# Patient Record
Sex: Male | Born: 1990 | Race: White | Hispanic: No | Marital: Single | Smoking: Never smoker
Health system: Southern US, Community
[De-identification: ages and names within clinical notes are randomized; demographics above are authoritative.]

## PROBLEM LIST (undated history)

## (undated) DIAGNOSIS — J45909 Unspecified asthma, uncomplicated: Secondary | ICD-10-CM

## (undated) HISTORY — DX: Unspecified asthma, uncomplicated: J45.909

## (undated) HISTORY — PX: OTHER SURGICAL HISTORY: SHX169

---

## 2015-05-14 ENCOUNTER — Encounter: Payer: Self-pay | Admitting: Medical

## 2015-05-14 ENCOUNTER — Telehealth: Payer: Self-pay | Admitting: Medical

## 2015-05-14 NOTE — Progress Notes (Signed)
This encounter was created in error - please disregard.

## 2015-05-14 NOTE — Telephone Encounter (Signed)
No Charge for today.

## 2015-05-14 NOTE — Telephone Encounter (Signed)
Pt called 10:18am stating that [place] and was not able to call sooner because we opened at 10:00am. Rescheduled for tomorrow. Charge or no charge?

## 2015-05-15 ENCOUNTER — Ambulatory Visit (INDEPENDENT_AMBULATORY_CARE_PROVIDER_SITE_OTHER): Payer: 59 | Admitting: Medical

## 2015-05-15 ENCOUNTER — Encounter: Payer: Self-pay | Admitting: Medical

## 2015-05-15 VITALS — BP 126/84 | HR 65 | Temp 98.1°F | Resp 16 | Ht 73.5 in | Wt 243.6 lb

## 2015-05-15 DIAGNOSIS — J300 Vasomotor rhinitis: Secondary | ICD-10-CM | POA: Diagnosis not present

## 2015-05-15 DIAGNOSIS — H9202 Otalgia, left ear: Secondary | ICD-10-CM

## 2015-05-15 DIAGNOSIS — K121 Other forms of stomatitis: Secondary | ICD-10-CM

## 2015-05-15 DIAGNOSIS — Z8709 Personal history of other diseases of the respiratory system: Secondary | ICD-10-CM | POA: Diagnosis not present

## 2015-05-15 LAB — CBC WITH DIFFERENTIAL/PLATELET
BASOS ABS: 0 10*3/uL (ref 0.0–0.1)
Basophils Relative: 0.4 % (ref 0.0–3.0)
Eosinophils Absolute: 0.1 10*3/uL (ref 0.0–0.7)
Eosinophils Relative: 1.2 % (ref 0.0–5.0)
HCT: 48.9 % (ref 39.0–52.0)
Hemoglobin: 16.6 g/dL (ref 13.0–17.0)
LYMPHS ABS: 2.2 10*3/uL (ref 0.7–4.0)
Lymphocytes Relative: 32 % (ref 12.0–46.0)
MCHC: 34 g/dL (ref 30.0–36.0)
MCV: 85 fl (ref 78.0–100.0)
MONO ABS: 0.7 10*3/uL (ref 0.1–1.0)
MONOS PCT: 10 % (ref 3.0–12.0)
NEUTROS ABS: 3.9 10*3/uL (ref 1.4–7.7)
NEUTROS PCT: 56.4 % (ref 43.0–77.0)
PLATELETS: 258 10*3/uL (ref 150.0–400.0)
RBC: 5.75 Mil/uL (ref 4.22–5.81)
RDW: 13.2 % (ref 11.5–15.5)
WBC: 6.8 10*3/uL (ref 4.0–10.5)

## 2015-05-15 MED ORDER — CLARITHROMYCIN ER 500 MG PO TB24: 1000.0000 mg | ORAL_TABLET | Freq: Every day | ORAL | Status: DC

## 2015-05-15 MED FILL — CLARITHROMYCIN ER 500 MG TA: 500 | 14 days supply | Qty: 28 | Fill #0

## 2015-05-15 NOTE — Addendum Note (Signed)
Addended by: Gwenevere AbbotSAGUIER, Elbia Paro M on: 05/15/2015 02:08 PM   Modules accepted: Orders

## 2015-05-15 NOTE — Progress Notes (Signed)
Pre visit review using our clinic review tool, if applicable. No additional management support is needed unless otherwise documented below in the visit note. 

## 2015-05-15 NOTE — Progress Notes (Signed)
Subjective:    Patient ID: Clarence Gonzales, male    DOB: [birth year], 25 y.o.   MRN: [id number]  HPI  I have reviewed pt PMH, PSH, FH, Social History and Surgical History  Pt is getting Artist. Pt not exercising much, rare caffeinated beverage, pt state some weight gain since being in school and working. Single.  Pt mentions some sinus symptoms/infections some times after drastic temperature changes. Will get nasal congestion and then sinus infections. About twice a year gets sinus infections.  Pt had blood work for insurance physical just recently. Labs were normal.  Pt in states in March he had 8 teeth excised. He states he had wisdom teeth got impacted and was decayed. He had some complications after surgery. Severe swelling post surgery. Some lesions in mouth after surgery.   Pt also had some cavities filled after procedure. Swelling with cavity filling as well. September is when he had complications after filling. 3 filling after September had some ulcers near anesthetic injection site. But no describes severe swelling.   Pt recently went [facility] and given oral gel on Monday for what he was told was apthous ulcer type lesion.  Just recently has sore throat for about 10 days. Sharp pain swallowing now. Also some ear pain let side. He is about to travel to [place] on Friday afternoon.   Pt had allergic reaction to pcn in the past. Severe reaction.   Pt states he has used zpack , biaxin and e-mycin in the past. No reactions.      Review of Systems  Constitutional: Negative for fever, chills and fatigue.  HENT: Positive for ear pain.        Ulcer/stomatitis.   Respiratory: Negative for cough, chest tightness, shortness of breath and wheezing.   Cardiovascular: Negative for chest pain and palpitations.  Neurological: Negative for dizziness, seizures, syncope, speech difficulty, weakness, numbness and headaches.    Hematological: Negative for adenopathy. Does not bruise/bleed easily.     History reviewed. No pertinent past medical history.  Social History   Social History  . Marital Status: Single    Spouse Name: N/A  . Number of Children: N/A  . Years of Education: N/A   Occupational History  . Not on file.   Social History Main Topics  . Smoking status: Never Smoker   . Smokeless tobacco: Never Used  . Alcohol Use: 0.0 oz/week    0 Standard drinks or equivalent per week     Comment: Occasional   . Drug Use: No  . Sexual Activity: Not on file   Other Topics Concern  . Not on file   Social History Narrative  . No narrative on file    History reviewed. No pertinent past surgical history.  Family History  Problem Relation Age of Onset  . Diabetes Maternal Grandfather     Allergies  Allergen Reactions  . Amoxicillin Anaphylaxis  . Penicillins Anaphylaxis  . Latex Other (See Comments)    Senstivity     No current outpatient prescriptions on file prior to visit.   No current facility-administered medications on file prior to visit.    BP 126/84 mmHg  Pulse 65  Temp(Src) 98.1 F (36.7 C) (Oral)  Resp 16  Ht 6' 1.5" (1.867 m)  Wt 243 lb 9.6 oz (110.496 kg)  BMI 31.70 kg/m2  SpO2 97%       Objective:   Physical Exam  General  Mental Status - Alert.  General Appearance - Well groomed. Not in acute distress.  Skin Rashes- No Rashes.  HEENT Head- Normal. Ear Auditory Canal - Left- Normal. Right - Normal.Tympanic Membrane- Left- mild dull pink appearance(no mastoid tenderness. No tragal tenderness. Right- Normal. Eye Sclera/Conjunctiva- Left- Normal. Right- Normal. Nose & Sinuses Nasal Mucosa- Left-  Not Boggy or  Congested. Right-  Not Boggy and  Congested.Bilateral no maxillary and no  frontal sinus pressure. Mouth & Throat Lips: Upper Lip- Normal: no dryness, cracking, pallor, cyanosis, or vesicular eruption. Lower Lip-Normal: no dryness, cracking,  pallor, cyanosis or vesicular eruption. Buccal Mucosa- Bilateral- Back of mouth left side. Posterior pharynx region and behind molar teeth 8mm diameter apthous ulcer type lesion. Oropharynx- No Discharge  Mild  Erythema. Tonsils: Characteristics- Bilateral- mild Erythema Size/Enlargement- Bilateral- No enlargement. Discharge- bilateral-None.   Neck Neck- Supple. No Masses.   Chest and Lung Exam Auscultation: Breath Sounds:-Clear even and unlabored.  Cardiovascular Auscultation:Rythm- Regular, rate and rhythm. Murmurs & Other Heart Sounds:Ausculatation of the heart reveal- No Murmurs.  Lymphatic Head & Neck General Head & Neck Lymphatics: Bilateral: Description- No Localized lymphadenopathy. Of neck region.       Assessment & Plan:  For your ear pain, history of sinus infections and history of dental complication will prescribe  biaxin antibioitic.  With your various dental procedures and complications, I do want to get cbc to assess your infection fighting cells.  I want you to give us update tomorrow how you feel when we update you on your labs.  I will refer you to ent and try to get you in by next Monday or Tuesday(if possible could be longer).  Go ahead and schedule appointment with me on Monday or Tuesday in event ent appointment delayed.  You may also need to see previous oral surgeon depending on how you respond to antibiotic.  If while out of town your conditions worsen be seen by urgent care or ED.

## 2015-05-15 NOTE — Assessment & Plan Note (Signed)
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

## 2015-05-15 NOTE — Patient Instructions (Addendum)
For your ear pain, history of sinus infections and history of dental complication will prescribe  biaxin antibioitic.  With your various dental procedures and complications, I do want to get cbc to assess your infection fighting cells.  I want you to give us update tomorrow how you feel when we update you on your labs.  I will refer you to ent and try to get you in by next Monday or Tuesday(if possible could be longer).  Go ahead and schedule appointment with me on Monday or Tuesday in event ent appointment delayed.  You may also need to see previous oral surgeon depending on how you respond to antibiotic.  If while out of town your conditions worsen be seen by urgent care or ED.

## 2015-05-16 ENCOUNTER — Telehealth: Payer: Self-pay | Admitting: Medical

## 2015-05-16 NOTE — Telephone Encounter (Signed)
Pt states not better or worse about the same. Only on second dose of antibiotic. His wbc were not elevated. Notified him of lab. Will follow up with me on Monday.

## 2015-05-20 ENCOUNTER — Telehealth: Payer: Self-pay | Admitting: Medical

## 2015-05-20 ENCOUNTER — Encounter: Payer: 59 | Admitting: Medical

## 2015-05-20 NOTE — Progress Notes (Signed)
This encounter was created in error - please disregard.

## 2015-05-22 NOTE — Telephone Encounter (Signed)
Pt was no show for f/u appt 05/20/15 11:00am, pt has not rescheduled, charge or no charge?

## 2015-05-23 NOTE — Telephone Encounter (Signed)
No charge. If he does again then charge.

## 2015-10-24 ENCOUNTER — Emergency Department (HOSPITAL_BASED_OUTPATIENT_CLINIC_OR_DEPARTMENT_OTHER)
Admission: EM | Admit: 2015-10-24 | Discharge: 2015-10-24 | Disposition: A | Discharge status: Home / Self Care | Payer: 59 | Attending: Emergency Medicine | Admitting: Emergency Medicine

## 2015-10-24 ENCOUNTER — Emergency Department (HOSPITAL_BASED_OUTPATIENT_CLINIC_OR_DEPARTMENT_OTHER): Payer: 59

## 2015-10-24 ENCOUNTER — Encounter (HOSPITAL_BASED_OUTPATIENT_CLINIC_OR_DEPARTMENT_OTHER): Payer: Self-pay

## 2015-10-24 DIAGNOSIS — Y92038 Other place in apartment as the place of occurrence of the external cause: Secondary | ICD-10-CM | POA: Diagnosis not present

## 2015-10-24 DIAGNOSIS — W108XXA Fall (on) (from) other stairs and steps, initial encounter: Secondary | ICD-10-CM | POA: Diagnosis not present

## 2015-10-24 DIAGNOSIS — S93402A Sprain of unspecified ligament of left ankle, initial encounter: Secondary | ICD-10-CM

## 2015-10-24 DIAGNOSIS — Y9389 Activity, other specified: Secondary | ICD-10-CM | POA: Diagnosis not present

## 2015-10-24 DIAGNOSIS — S99912A Unspecified injury of left ankle, initial encounter: Secondary | ICD-10-CM | POA: Diagnosis present

## 2015-10-24 DIAGNOSIS — Y999 Unspecified external cause status: Secondary | ICD-10-CM | POA: Insufficient documentation

## 2015-10-24 MED ORDER — IBUPROFEN 800 MG PO TABS
800.0000 mg | ORAL_TABLET | Freq: Once | ORAL | Status: AC
  Administered 2015-10-24: 800 mg via ORAL
  Filled 2015-10-24: qty 1

## 2015-10-24 NOTE — ED Notes (Addendum)
Feel down steps approx 30 min PTA-pain to left ankle-presents to triage in w/c-abrasion noted to right knee-denies pain to knee-states "just need a bandaid"

## 2015-10-24 NOTE — ED Provider Notes (Signed)
CSN: [id number]     Arrival date & time 10/24/15  2012 History  By signing my name below, I, Clarence Gonzales, attest that this documentation has been prepared under the direction and in the presence of Melene Planan Helaine Yackel, DO.  Electronically Signed: Gillis EndsJasmyn B. Lyn Gonzales, ED Scribe. 10/24/2015. 9:21 PM.   Chief Complaint  Patient presents with  . Fall   Patient is a [age] y.o. male presenting with ankle pain. The history is provided by the patient. No language interpreter was used.  Ankle Pain Location:  Ankle Injury: yes   Mechanism of injury: fall   Fall:    Fall occurred:  Down stairs   Entrapped after fall: no   Ankle location:  L ankle Pain details:    Quality:  Sharp   Radiates to:  Does not radiate   Severity:  Mild   Onset quality:  Gradual   Duration:  1 hour   Timing:  Constant   Progression:  Unchanged Chronicity:  New Dislocation: no   Prior injury to area:  Yes Worsened by:  Bearing weight and flexion Ineffective treatments:  None tried Associated symptoms: no fever    HPI Comments: Clarence Gonzales is a 25 y.o. male who presents to the Emergency Department complaining of sudden onset, constant, left ankle pain s/p fall that occurred PTA. Pt reports he was moving boxes out of his apartment when he missed the bottom step and fell to the ground, rolling his left ankle. Pt has associated swelling of the ankle. There are no modifying factors. Tetanus status is up to date. Pt has no other complaints at this time.  History reviewed. No pertinent past medical history. History reviewed. No pertinent past surgical history. Family History  Problem Relation Age of Onset  . Diabetes Maternal Grandfather    Social History  Substance Use Topics  . Smoking status: Never Smoker   . Smokeless tobacco: Never Used  . Alcohol Use: No     Comment:      Review of Systems  Constitutional: Negative for fever and chills.  HENT: Negative for congestion and facial swelling.   Eyes: Negative  for discharge and visual disturbance.  Respiratory: Negative for shortness of breath.   Cardiovascular: Negative for chest pain and palpitations.  Gastrointestinal: Negative for vomiting, abdominal pain and diarrhea.  Musculoskeletal: Positive for myalgias, joint swelling and arthralgias.  Skin: Negative for color change and rash.  Neurological: Negative for tremors, syncope and headaches.  Psychiatric/Behavioral: Negative for confusion and dysphoric mood.  All other systems reviewed and are negative.   Allergies  Amoxicillin; Penicillins; and Latex  Home Medications   Prior to Admission medications   Not on File   BP 128/80 mmHg  Pulse 66  Temp(Src) 98.2 F (36.8 C) (Oral)  Resp 18  Ht 6\' 1"  (1.854 m)  Wt 235 lb (106.595 kg)  BMI 31.01 kg/m2  SpO2 100% Physical Exam  Constitutional: He is oriented to person, place, and time. He appears well-developed and well-nourished.  HENT:  Head: Normocephalic and atraumatic.  Eyes: EOM are normal. Pupils are equal, round, and reactive to light.  Neck: Normal range of motion. Neck supple. No JVD present.  Cardiovascular: Normal rate and regular rhythm.  Exam reveals no gallop and no friction rub.   No murmur heard. Pulmonary/Chest: No respiratory distress. He has no wheezes.  Abdominal: He exhibits no distension. There is no rebound and no guarding.  Musculoskeletal: Normal range of motion. He exhibits tenderness.  Tenderness about  medial and lateral maleoli of left ankle. No tenderness at proximal fibula.   Neurological: He is alert and oriented to person, place, and time.  Pulse, motor and sensation is intact.  Skin: No rash noted. No pallor.  Abrasion to left patella of right knee.    Psychiatric: He has a normal mood and affect. His behavior is normal.  Nursing note and vitals reviewed.   ED Course  Procedures (including critical care time) DIAGNOSTIC STUDIES: Oxygen Saturation is 100% on RA, normal by my interpretation.     COORDINATION OF CARE: 9:15 PM-Discussed treatment plan which includes Left ankle X-ray with pt at bedside and pt agreed to plan. Will order Ibuprofen.  Imaging Review Dg Ankle Complete Left  10/24/2015  CLINICAL DATA:  Twisting injury left ankle coming down stairs tonight with lateral malleolar pain and swelling. EXAM: LEFT ANKLE COMPLETE - 3+ VIEW COMPARISON:  None. FINDINGS: There is no evidence of fracture, dislocation, or joint effusion. There is no evidence of arthropathy or other focal bone abnormality. Soft tissues are unremarkable. IMPRESSION: Negative. Electronically Signed   By: Elberta Fortisaniel  Boyle M.D.   On: 10/24/2015 21:30   I have personally reviewed and evaluated these images and lab results as part of my medical decision-making.    MDM   Final diagnoses:  Left ankle sprain, initial encounter    25 yo M With a chief complaint of left ankle pain. This is after a rolling mechanism. Patient with pain of about bilateral malleolus. No noted knee pain or proximal fibular tenderness. X-ray of ankle is negative. Discharge home.  I personally performed the services described in this documentation, which was scribed in my presence. The recorded information has been reviewed and is accurate.    10:15 PM:  I have discussed the diagnosis/risks/treatment options with the patient and family and believe the pt to be eligible for discharge home to follow-up with PCP. We also discussed returning to the ED immediately if new or worsening sx occur. We discussed the sx which are most concerning (e.g., sudden worsening pain, fever, inability to tolerate by mouth) that necessitate immediate return. Medications administered to the patient during their visit and any new prescriptions provided to the patient are listed below.  Medications given during this visit Medications  ibuprofen (ADVIL,MOTRIN) tablet 800 mg (800 mg Oral Given 10/24/15 2116)    There are no discharge medications for this  patient.   The patient appears reasonably screen and/or stabilized for discharge and I doubt any other medical condition or other [facility] requiring further screening, evaluation, or treatment in the ED at this time prior to discharge.     Melene Planan Aashish Hamm, DO 10/24/15 2215

## 2015-10-24 NOTE — ED Notes (Signed)
Pt verbalizes understanding of d/c instructions and denies any further needs at this time. 

## 2015-10-24 NOTE — Discharge Instructions (Signed)
Take 4 over the counter ibuprofen tablets 3 times a day or 2 over-the-counter naproxen tablets twice a day for pain.  Acute Ankle Sprain With Phase I Rehab An acute ankle sprain is a partial or complete tear in one or more of the ligaments of the ankle due to traumatic injury. The severity of the injury depends on both the number of ligaments sprained and the grade of sprain. There are 3 grades of sprains.   A grade 1 sprain is a mild sprain. There is a slight pull without obvious tearing. There is no loss of strength, and the muscle and ligament are the correct length.  A grade 2 sprain is a moderate sprain. There is tearing of fibers within the substance of the ligament where it connects two bones or two cartilages. The length of the ligament is increased, and there is usually decreased strength.  A grade 3 sprain is a complete rupture of the ligament and is uncommon. In addition to the grade of sprain, there are three types of ankle sprains.  Lateral ankle sprains: This is a sprain of one or more of the three ligaments on the outer side (lateral) of the ankle. These are the most common sprains. Medial ankle sprains: There is one large triangular ligament of the inner side (medial) of the ankle that is susceptible to injury. Medial ankle sprains are less common. Syndesmosis, "high ankle," sprains: The syndesmosis is the ligament that connects the two bones of the lower leg. Syndesmosis sprains usually only occur with very severe ankle sprains. SYMPTOMS  Pain, tenderness, and swelling in the ankle, starting at the side of injury that may progress to the whole ankle and foot with time.  "Pop" or tearing sensation at the time of injury.  Bruising that may spread to the heel.  Impaired ability to walk soon after injury. CAUSES   Acute ankle sprains are caused by trauma placed on the ankle that temporarily forces or pries the anklebone (talus) out of its normal socket.  Stretching or  tearing of the ligaments that normally hold the joint in place (usually due to a twisting injury). RISK INCREASES WITH:  Previous ankle sprain.  Sports in which the foot may land awkwardly (i.e., basketball, volleyball, or soccer) or walking or running on uneven or rough surfaces.  Shoes with inadequate support to prevent sideways motion when stress occurs.  Poor strength and flexibility.  Poor balance skills.  Contact sports. PREVENTION   Warm up and stretch properly before activity.  Maintain physical fitness:  Ankle and leg flexibility, muscle strength, and endurance.  Cardiovascular fitness.  Balance training activities.  Use proper technique and have a coach correct improper technique.  Taping, protective strapping, bracing, or high-top tennis shoes may help prevent injury. Initially, tape is best; however, it loses most of its support function within 10 to 15 minutes.  Wear proper-fitted protective shoes (High-top shoes with taping or bracing is more effective than either alone).  Provide the ankle with support during sports and practice activities for 12 months following injury. PROGNOSIS   If treated properly, ankle sprains can be expected to recover completely; however, the length of recovery depends on the degree of injury.  A grade 1 sprain usually heals enough in 5 to 7 days to allow modified activity and requires an average of 6 weeks to heal completely.  A grade 2 sprain requires 6 to 10 weeks to heal completely.  A grade 3 sprain requires 12 to 16 weeks to  heal.  A syndesmosis sprain often takes more than 3 months to heal. RELATED COMPLICATIONS   Frequent recurrence of symptoms may result in a chronic problem. Appropriately addressing the problem the first time decreases the frequency of recurrence and optimizes healing time. Severity of the initial sprain does not predict the likelihood of later instability.  Injury to other structures (bone, cartilage,  or tendon).  A chronically unstable or arthritic ankle joint is a possibility with repeated sprains. TREATMENT Treatment initially involves the use of ice, medication, and compression bandages to help reduce pain and inflammation. Ankle sprains are usually immobilized in a walking cast or boot to allow for healing. Crutches may be recommended to reduce pressure on the injury. After immobilization, strengthening and stretching exercises may be necessary to regain strength and a full range of motion. Surgery is rarely needed to treat ankle sprains. MEDICATION   Nonsteroidal anti-inflammatory medications, such as aspirin and ibuprofen (do not take for the first 3 days after injury or within 7 days before surgery), or other minor pain relievers, such as acetaminophen, are often recommended. Take these as directed by your caregiver. Contact your caregiver immediately if any bleeding, stomach upset, or signs of an allergic reaction occur from these medications.  Ointments applied to the skin may be helpful.  Pain relievers may be prescribed as necessary by your caregiver. Do not take prescription pain medication for longer than 4 to 7 days. Use only as directed and only as much as you need. HEAT AND COLD  Cold treatment (icing) is used to relieve pain and reduce inflammation for acute and chronic cases. Cold should be applied for 10 to 15 minutes every 2 to 3 hours for inflammation and pain and immediately after any activity that aggravates your symptoms. Use ice packs or an ice massage.  Heat treatment may be used before performing stretching and strengthening activities prescribed by your caregiver. Use a heat pack or a warm soak. SEEK IMMEDIATE MEDICAL CARE IF:   Pain, swelling, or bruising worsens despite treatment.  You experience pain, numbness, discoloration, or coldness in the foot or toes.  New, unexplained symptoms develop (drugs used in treatment may produce side effects.) EXERCISES    PHASE I EXERCISES RANGE OF MOTION (ROM) AND STRETCHING EXERCISES - Ankle Sprain, Acute Phase I, Weeks 1 to 2 These exercises may help you when beginning to restore flexibility in your ankle. You will likely work on these exercises for the 1 to 2 weeks after your injury. Once your physician, physical therapist, or athletic trainer sees adequate progress, he or she will advance your exercises. While completing these exercises, remember:   Restoring tissue flexibility helps normal motion to return to the joints. This allows healthier, less painful movement and activity.  An effective stretch should be held for at least 30 seconds.  A stretch should never be painful. You should only feel a gentle lengthening or release in the stretched tissue. RANGE OF MOTION - Dorsi/Plantar Flexion  While sitting with your right / left knee straight, draw the top of your foot upwards by flexing your ankle. Then reverse the motion, pointing your toes downward.  Hold each position for __________ seconds.  After completing your first set of exercises, repeat this exercise with your knee bent. Repeat __________ times. Complete this exercise __________ times per day.  RANGE OF MOTION - Ankle Alphabet  Imagine your right / left big toe is a pen.  Keeping your hip and knee still, write out the entire  alphabet with your "pen." Make the letters as large as you can without increasing any discomfort. Repeat __________ times. Complete this exercise __________ times per day.  STRENGTHENING EXERCISES - Ankle Sprain, Acute -Phase I, Weeks 1 to 2 These exercises may help you when beginning to restore strength in your ankle. You will likely work on these exercises for 1 to 2 weeks after your injury. Once your physician, physical therapist, or athletic trainer sees adequate progress, he or she will advance your exercises. While completing these exercises, remember:   Muscles can gain both the endurance and the strength needed  for everyday activities through controlled exercises.  Complete these exercises as instructed by your physician, physical therapist, or athletic trainer. Progress the resistance and repetitions only as guided.  You may experience muscle soreness or fatigue, but the pain or discomfort you are trying to eliminate should never worsen during these exercises. If this pain does worsen, stop and make certain you are following the directions exactly. If the pain is still present after adjustments, discontinue the exercise until you can discuss the trouble with your clinician. STRENGTH - Dorsiflexors  Secure a rubber exercise band/tubing to a fixed object (i.e., table, pole) and loop the other end around your right / left foot.  Sit on the floor facing the fixed object. The band/tubing should be slightly tense when your foot is relaxed.  Slowly draw your foot back toward you using your ankle and toes.  Hold this position for __________ seconds. Slowly release the tension in the band and return your foot to the starting position. Repeat __________ times. Complete this exercise __________ times per day.  STRENGTH - Plantar-flexors   Sit with your right / left leg extended. Holding onto both ends of a rubber exercise band/tubing, loop it around the ball of your foot. Keep a slight tension in the band.  Slowly push your toes away from you, pointing them downward.  Hold this position for __________ seconds. Return slowly, controlling the tension in the band/tubing. Repeat __________ times. Complete this exercise __________ times per day.  STRENGTH - Ankle Eversion  Secure one end of a rubber exercise band/tubing to a fixed object (table, pole). Loop the other end around your foot just before your toes.  Place your fists between your knees. This will focus your strengthening at your ankle.  Drawing the band/tubing across your opposite foot, slowly, pull your little toe out and up. Make sure the  band/tubing is positioned to resist the entire motion.  Hold this position for __________ seconds. Have your muscles resist the band/tubing as it slowly pulls your foot back to the starting position.  Repeat __________ times. Complete this exercise __________ times per day.  STRENGTH - Ankle Inversion  Secure one end of a rubber exercise band/tubing to a fixed object (table, pole). Loop the other end around your foot just before your toes.  Place your fists between your knees. This will focus your strengthening at your ankle.  Slowly, pull your big toe up and in, making sure the band/tubing is positioned to resist the entire motion.  Hold this position for __________ seconds.  Have your muscles resist the band/tubing as it slowly pulls your foot back to the starting position. Repeat __________ times. Complete this exercises __________ times per day.  STRENGTH - Towel Curls  Sit in a chair positioned on a non-carpeted surface.  Place your right / left foot on a towel, keeping your heel on the floor.  Pull the  towel toward your heel by only curling your toes. Keep your heel on the floor.  If instructed by your physician, physical therapist, or athletic trainer, add weight to the end of the towel. Repeat __________ times. Complete this exercise __________ times per day.   This information is not intended to replace advice given to you by your health care provider. Make sure you discuss any questions you have with your health care provider.   Document Released: 11/19/2004 Document Revised: 05/11/2014 Document Reviewed: 08/02/2008 Elsevier Interactive Patient Education [employer] Inc.

## 2015-10-24 NOTE — ED Notes (Signed)
Pt rolled left ankle coming down the stairs, swelling and pain to ankle and abrasion to right knee.  Capillary refill good, pt able to wiggle toes and pulses strong.  MD att bedside.

## 2015-10-24 NOTE — ED Notes (Signed)
Pt declines crutches  

## 2016-01-27 ENCOUNTER — Ambulatory Visit (INDEPENDENT_AMBULATORY_CARE_PROVIDER_SITE_OTHER): Payer: BLUE CROSS/BLUE SHIELD | Admitting: Medical

## 2016-01-27 ENCOUNTER — Encounter: Payer: Self-pay | Admitting: Medical

## 2016-01-27 VITALS — BP 131/78 | HR 59 | Temp 98.7°F | Ht 73.5 in | Wt 253.6 lb

## 2016-01-27 DIAGNOSIS — R1013 Epigastric pain: Secondary | ICD-10-CM

## 2016-01-27 DIAGNOSIS — F4323 Adjustment disorder with mixed anxiety and depressed mood: Secondary | ICD-10-CM

## 2016-01-27 MED ORDER — RANITIDINE HCL 150 MG PO CAPS: 150.0000 mg | ORAL_CAPSULE | Freq: Two times a day (BID) | ORAL | 0 refills | Status: DC

## 2016-01-27 NOTE — Progress Notes (Signed)
Pre visit review using our clinic tool,if applicable. No additional management support is needed unless otherwise documented below in the visit note.  

## 2016-01-27 NOTE — Progress Notes (Signed)
Subjective:    Patient ID: Clarence Gonzales, male    DOB: 29-May-1990, 25 y.o.   MRN: [id number]  HPI Pt in for some abdomen pain.   Pt did have some pepto bismal this weekend. Pt pain is epigastric region and  low level pain. On and off pain. Patient notes some pain worse after eating.  Recently decreased appetitie. No nauseau or vomiting. Some increase belching. No back pain with stomach pain. Pt states high pain tolerance.    Pt states he had some black appearing stools about one month ago. He had that for 2 stools. Then normalized. Then had some pain in abdomen around that time. No peptobismol around time had stools.(he not aware).  Pt states having some stress and difficulty with working full time both school and work. 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 Some decreased mood with low level anxiety. Concentation issues at end of the day. But denies ADD. Wants something other than stimulants(he mentioned provigil). But I don't think indicated. His work schedule is normal hours.  Pt does not think he has add.   Pt works 7:30- 4:30. School 6-9 pm. Working on [employer]. Some mild depressed mood. Some stress and anxiety.   Pt does not want to be on stimulant such as adderal.    Review of Systems  Constitutional: Negative for activity change, chills, fatigue and fever.  Respiratory: Negative for cough, choking, chest tightness and wheezing.   Cardiovascular: Negative for chest pain and palpitations.  Gastrointestinal: Positive for abdominal pain. Negative for abdominal distention, blood in stool, constipation, diarrhea, nausea, rectal pain and vomiting.       See hpi. Some belching.  Musculoskeletal: Negative for back pain.  Skin: Negative for rash.  Neurological: Negative for dizziness, speech difficulty and headaches.  Hematological: Negative for adenopathy. Does not bruise/bleed easily.  Psychiatric/Behavioral: Negative for agitation, confusion, dysphoric mood and sleep  disturbance. The patient is not nervous/anxious.    No past medical history on file.   Social History   Social History  . Marital status: Single    Spouse name: N/A  . Number of children: N/A  . Years of education: N/A   Occupational History  . Not on file.   Social History Main Topics  . Smoking status: Never Smoker  . Smokeless tobacco: Never Used  . Alcohol use No     Comment:    . Drug use: No  . Sexual activity: Not on file   Other Topics Concern  . Not on file   Social History Narrative  . No narrative on file    No past surgical history on file.  Family History  Problem Relation Age of Onset  . Diabetes Maternal Grandfather     Allergies  Allergen Reactions  . Amoxicillin Anaphylaxis  . Penicillins Anaphylaxis  . Latex Other (See Comments)    Senstivity     No current outpatient prescriptions on file prior to visit.   No current facility-administered medications on file prior to visit.     BP 131/78   Pulse (!) 59   Temp 98.7 F (37.1 C) (Oral)   Ht 6' 1.5" (1.867 m)   Wt 253 lb 9.6 oz (115 kg)   SpO2 96%   BMI 33.00 kg/m       Objective:   Physical Exam  General Appearance- Not in acute distress.  HEENT Eyes- Scleraeral/Conjuntiva-bilat- Not Yellow. Mouth & Throat- Normal.  Chest and Lung Exam Auscultation: Breath sounds:-Normal. Adventitious sounds:-  No Adventitious sounds.  Cardiovascular Auscultation:Rythm - Regular. Heart Sounds -Normal heart sounds.  Abdomen Inspection:-Inspection Normal.  Palpation/Perucssion: Palpation and Percussion of the abdomen reveal- faint epigastric Tender, No Rebound tenderness, No rigidity(Guarding) and No Palpable abdominal masses.  Liver:-Normal.  Spleen:- Normal.   Back- no cva tenderness.      Assessment & Plan:  For you abdomen pain will get cbc, cmp, amylase, lipase, and  h pylori breath test. Ifob test as well.  Ranitidine rx.  Healthy diet guidelines.  For your mood, low  level anxiety, lack of motivation and concentration issues you could consider wellbutrin rx. If you let me know /call me I will send rx to your pharmacy.  Follow up 2 weeks or as needed   Zymier Rodgers, Ramon Dredge, PA-C

## 2016-01-27 NOTE — Patient Instructions (Addendum)
For you abdomen pain will get cbc, cmp, amylase, lipase, and  h pylori breath test. ifob test as well.  Ranitidine rx.  Healthy diet guidelines.  For your mood, low level anxiety, lack of motivation and concentration issues you could consider wellbutrin rx. If you let me know /call me I will send rx to your pharmacy.  Follow up 2 weeks or as needed  Food Choices for Gastroesophageal Reflux Disease, Adult When you have gastroesophageal reflux disease (GERD), the foods you eat and your eating habits are very important. Choosing the right foods can help ease the discomfort of GERD. WHAT GENERAL GUIDELINES DO I NEED TO FOLLOW?  Choose fruits, vegetables, whole grains, low-fat dairy products, and low-fat meat, fish, and poultry.  Limit fats such as oils, salad dressings, butter, nuts, and avocado.  Keep a food diary to identify foods that cause symptoms.  Avoid foods that cause reflux. These may be different for different people.  Eat frequent small meals instead of three large meals each day.  Eat your meals slowly, in a relaxed setting.  Limit fried foods.  Cook foods using methods other than frying.  Avoid drinking alcohol.  Avoid drinking large amounts of liquids with your meals.  Avoid bending over or lying down until 2-3 hours after eating. WHAT FOODS ARE NOT RECOMMENDED? The following are some foods and drinks that may worsen your symptoms: Vegetables Tomatoes. Tomato juice. Tomato and spaghetti sauce. Chili peppers. Onion and garlic. Horseradish. Fruits Oranges, grapefruit, and lemon (fruit and juice). Meats High-fat meats, fish, and poultry. This includes hot dogs, ribs, ham, sausage, salami, and bacon. Dairy Whole milk and chocolate milk. Sour cream. Cream. Butter. Ice cream. Cream cheese.  Beverages Coffee and tea, with or without caffeine. Carbonated beverages or energy drinks. Condiments Hot sauce. Barbecue sauce.  Sweets/Desserts Chocolate and cocoa. Donuts.  Peppermint and spearmint. Fats and Oils High-fat foods, including [place] fries and potato chips. Other Vinegar. Strong spices, such as black pepper, white pepper, red pepper, cayenne, curry powder, cloves, ginger, and chili powder. The items listed above may not be a complete list of foods and beverages to avoid. Contact your dietitian for more information.   This information is not intended to replace advice given to you by your health care provider. Make sure you discuss any questions you have with your health care provider.   Document Released: 04/20/2005 Document Revised: 05/11/2014 Document Reviewed: 02/22/2013 Elsevier Interactive Patient Education [employer] Inc.

## 2016-01-28 LAB — COMPREHENSIVE METABOLIC PANEL
ALT: 13 U/L (ref 0–53)
AST: 21 U/L (ref 0–37)
Albumin: 4.5 g/dL (ref 3.5–5.2)
Alkaline Phosphatase: 66 U/L (ref 39–117)
BILIRUBIN TOTAL: 0.6 mg/dL (ref 0.2–1.2)
BUN: 14 mg/dL (ref 6–23)
CHLORIDE: 102 meq/L (ref 96–112)
CO2: 31 meq/L (ref 19–32)
CREATININE: 1.14 mg/dL (ref 0.40–1.50)
Calcium: 9.3 mg/dL (ref 8.4–10.5)
GFR: 84.51 mL/min (ref >60.00)
GLUCOSE: 72 mg/dL (ref 70–99)
Potassium: 3.8 mEq/L (ref 3.5–5.1)
SODIUM: 140 meq/L (ref 135–145)
Total Protein: 7.2 g/dL (ref 6.0–8.3)

## 2016-01-28 LAB — CBC WITH DIFFERENTIAL/PLATELET
BASOS PCT: 0.3 % (ref 0.0–3.0)
Basophils Absolute: 0 10*3/uL (ref 0.0–0.1)
EOS ABS: 0.1 10*3/uL (ref 0.0–0.7)
Eosinophils Relative: 2 % (ref 0.0–5.0)
HCT: 45.9 % (ref 39.0–52.0)
Hemoglobin: 16.1 g/dL (ref 13.0–17.0)
LYMPHS ABS: 2.1 10*3/uL (ref 0.7–4.0)
LYMPHS PCT: 33.8 % (ref 12.0–46.0)
MCHC: 35.1 g/dL (ref 30.0–36.0)
MCV: 84.3 fl (ref 78.0–100.0)
Monocytes Absolute: 0.6 10*3/uL (ref 0.1–1.0)
Monocytes Relative: 9.6 % (ref 3.0–12.0)
NEUTROS ABS: 3.4 10*3/uL (ref 1.4–7.7)
NEUTROS PCT: 54.3 % (ref 43.0–77.0)
PLATELETS: 222 10*3/uL (ref 150.0–400.0)
RBC: 5.44 Mil/uL (ref 4.22–5.81)
RDW: 13.1 % (ref 11.5–15.5)
WBC: 6.2 10*3/uL (ref 4.0–10.5)

## 2016-01-28 LAB — AMYLASE: AMYLASE: 27 U/L (ref 27–131)

## 2016-01-28 LAB — LIPASE: LIPASE: 14 U/L (ref 11.0–59.0)

## 2016-01-29 LAB — H. PYLORI BREATH TEST: H. pylori Breath Test: NOT DETECTED

## 2016-02-03 ENCOUNTER — Other Ambulatory Visit: Payer: 59

## 2016-02-05 ENCOUNTER — Other Ambulatory Visit: Payer: 59

## 2016-02-05 LAB — FECAL OCCULT BLOOD, IMMUNOCHEMICAL: FECAL OCCULT BLD: NEGATIVE

## 2016-09-02 ENCOUNTER — Ambulatory Visit (INDEPENDENT_AMBULATORY_CARE_PROVIDER_SITE_OTHER): Payer: 59 | Admitting: Medical

## 2016-09-02 ENCOUNTER — Encounter: Payer: Self-pay | Admitting: Medical

## 2016-09-02 VITALS — BP 128/78 | HR 64 | Temp 98.0°F | Resp 16 | Ht 71.5 in | Wt 249.0 lb

## 2016-09-02 DIAGNOSIS — Z8249 Family history of ischemic heart disease and other diseases of the circulatory system: Secondary | ICD-10-CM

## 2016-09-02 DIAGNOSIS — R519 Headache, unspecified: Secondary | ICD-10-CM

## 2016-09-02 DIAGNOSIS — J301 Allergic rhinitis due to pollen: Secondary | ICD-10-CM | POA: Diagnosis not present

## 2016-09-02 DIAGNOSIS — R51 Headache: Secondary | ICD-10-CM

## 2016-09-02 DIAGNOSIS — Z8709 Personal history of other diseases of the respiratory system: Secondary | ICD-10-CM

## 2016-09-02 DIAGNOSIS — M25519 Pain in unspecified shoulder: Secondary | ICD-10-CM | POA: Diagnosis not present

## 2016-09-02 LAB — SEDIMENTATION RATE: Sed Rate: 4 mm/h (ref 0–15)

## 2016-09-02 LAB — C-REACTIVE PROTEIN: CRP: 0.1 mg/dL — ABNORMAL LOW (ref 0.5–20.0)

## 2016-09-02 MED ORDER — CLARITHROMYCIN ER 500 MG PO TB24: 1000.0000 mg | ORAL_TABLET | Freq: Every day | ORAL | 0 refills | Status: DC

## 2016-09-02 MED ORDER — LEVOCETIRIZINE DIHYDROCHLORIDE 5 MG PO TABS: 5.0000 mg | ORAL_TABLET | Freq: Every evening | ORAL | 0 refills | Status: DC

## 2016-09-02 MED ORDER — DOXYCYCLINE HYCLATE 100 MG PO TABS: 100.0000 mg | ORAL_TABLET | Freq: Two times a day (BID) | ORAL | 0 refills | Status: DC

## 2016-09-02 MED ORDER — AZELASTINE HCL 0.1 % NA SOLN: 2.0000 | Freq: Two times a day (BID) | NASAL | 3 refills | Status: DC

## 2016-09-02 MED FILL — CLARITHROMYCIN ER 500 MG TA: 500 | 10 days supply | Qty: 20 | Fill #0

## 2016-09-02 MED FILL — AZELASTINE 0.1% (137 MCG) S: 0.1 | 25 days supply | Qty: 30 | Fill #0

## 2016-09-02 MED FILL — LEVOCETIRIZINE 5 MG TABLET: 5 | 30 days supply | Qty: 30 | Fill #0

## 2016-09-02 NOTE — Progress Notes (Signed)
Pre visit review using our clinic review tool, if applicable. No additional management support is needed unless otherwise documented below in the visit note/SLS  

## 2016-09-02 NOTE — Progress Notes (Signed)
Subjective:    Patient ID: Clarence Gonzales, male    DOB: 1991/03/04, 26 y.o.   MRN: [id number]  HPI  Pt in for follow up.  Pt has history of sinus issues in the past. Pt recently sick with nasal congestion, runny nose and pnd. Pt states some mucous when he coughs and some sinus pressure. He mentioned viral illness about one month ago.   Pt has history of polyp in sinus years ago. Pt states he was told to follow up with ENT. 8 years since seen by ent.  Also he mentioned  His mom had history of MI just recently. Also his grandmother had hypertrophy cardiomyopathy related to htn for 40 years.  This causes some concern for pt. Pt understand or thinks he might have genetic predisposition.   With exercise will get some mild shoulder pain rt side. Also some head pressure sensation when exercises. Theses symptoms occur on cardio exercises. He stats his hr when he exercises are at time 180-200. Pt states dull pain in his arm. As he stops exercising the pain will resolve.  Pt states at other offices when he get bp checked his blood pressure can run on high side.   Review of Systems  Constitutional: Negative for chills, fatigue and fever.  HENT: Positive for congestion, postnasal drip, sinus pain and sinus pressure. Negative for sneezing and sore throat.   Respiratory: Negative for cough, chest tightness, shortness of breath and wheezing.   Cardiovascular: Negative for chest pain and palpitations.       Tachycardia. Above normal on moderate cardio activity.  Gastrointestinal: Negative for abdominal pain, constipation, diarrhea and nausea.  Musculoskeletal: Negative for back pain.       Rt shoulder pain on cardio type exercises.  Skin: Negative for rash.  Neurological: Negative for dizziness, syncope, weakness, numbness and headaches.  Hematological: Negative for adenopathy. Does not bruise/bleed easily.  Psychiatric/Behavioral: Negative for agitation and behavioral problems. The patient is not  nervous/anxious.    No past medical history on file.   Social History   Social History  . Marital status: Single    Spouse name: N/A  . Number of children: N/A  . Years of education: N/A   Occupational History  . Not on file.   Social History Main Topics  . Smoking status: Never Smoker  . Smokeless tobacco: Never Used  . Alcohol use No     Comment:    . Drug use: No  . Sexual activity: Not on file   Other Topics Concern  . Not on file   Social History Narrative  . No narrative on file    No past surgical history on file.  Family History  Problem Relation Age of Onset  . Diabetes Maternal Grandfather     Allergies  Allergen Reactions  . Amoxicillin Anaphylaxis  . Penicillins Anaphylaxis  . Latex Other (See Comments)    Senstivity     No current outpatient prescriptions on file prior to visit.   No current facility-administered medications on file prior to visit.     BP 128/78 (BP Location: Right Arm, Patient Position: Sitting, Cuff Size: Large)   Pulse 64   Temp 98 F (36.7 C) (Oral)   Resp 16   Ht 5' 11.5" (1.816 m)   Wt 249 lb (112.9 kg)   SpO2 98%   BMI 34.24 kg/m       Objective:   Physical Exam  General Mental Status- Alert. General Appearance- Not in acute  distress.   Skin General: Color- Normal Color. Moisture- Normal Moisture.  Neck Carotid Arteries- Normal color. Moisture- Normal Moisture. No carotid bruits. No JVD.  Chest and Lung Exam Auscultation: Breath Sounds:-Normal.  Cardiovascular Auscultation:Rythm- Regular. Murmurs & Other Heart Sounds:Auscultation of the heart reveals- No Murmurs.  Abdomen Inspection:-Inspeection Normal. Palpation/Percussion:Note:No mass. Palpation and Percussion of the abdomen reveal- Non Tender, Non Distended + BS, no rebound or guarding.    Neurologic Cranial Nerve exam:- CN III-XII intact(No nystagmus), symmetric smile. tStrength:- 5/5 equal and symmetric strength both upper and lower  extremities.  Rt shoulder- good rom.no pain on palpation.   HEENT Head- Normal. Ear Auditory Canal - Left- Normal. Right - Normal.Tympanic Membrane- Left- Normal. Right- Normal. Eye Sclera/Conjunctiva- Left- Normal. Right- Normal. Nose & Sinuses Nasal Mucosa- Left-  Boggy and Congested. Right-  Boggy and  Congested.Bilateral maxillary and frontal sinus pressure. Mouth & Throat Lips: Upper Lip- Normal: no dryness, cracking, pallor, cyanosis, or vesicular eruption. Lower Lip-Normal: no dryness, cracking, pallor, cyanosis or vesicular eruption. Buccal Mucosa- Bilateral- No Aphthous ulcers. Oropharynx- No Discharge or Erythema. Tonsils: Characteristics- Bilateral- No Erythema or Congestion. Size/Enlargement- Bilateral- No enlargement. Discharge- bilateral-None.     .     Assessment & Plan:  ekg  Interpretation-Sinus rhythm but possilble anterior fascicular block.  For allergies will rx xyzal. You can use saline nasal spray. Also can use astelin.   Will refer to ent for hx of nasal polyps.  Will refer to cardiologist for severe tachycardia and rt  shoulder pain on cardio exercises.(also refer based on family history).  For shoulder pain/arthralgia will go ahead and get inflammatory studies as you requested.  Pressure in head on exercise may be sinus pressure related.(but recommend get otc bp cuff and check your blood pressure daily)  Follow up in  10-14 days or as needed  Time spent with pt 40 minutes. Over 50% spent counseling with pt regarding differential dx of his rt shoulder pain, increased hr, head pressure and approach we would take th work up in our office and approach cardiologist might take. Time of counseling included ekg interpretation. Time included explaining computer read of ekg.(explaining physiology of this potential rhythm)

## 2016-09-02 NOTE — Patient Instructions (Addendum)
For allergies will rx xyzal. You can use saline nasal spray. Also can use astelin. For possible sinus infection biaxin xl  Will refer to ent for hx of nasal polyps.  Will refer to cardiologist for severe tachycardia and rt  shoulder pain on cardio exercises.(also refer based on family history).  For shoulder pain/arthralgia will go ahead and get inflammatory studies as you requested.  Pressure in head on exercise may be sinus pressure related.(but recommend get otc bp cuff and check your blood pressure daily)  Follow up in  10-14 days or as needed

## 2016-09-03 ENCOUNTER — Telehealth: Payer: Self-pay | Admitting: Medical

## 2016-09-03 DIAGNOSIS — R768 Other specified abnormal immunological findings in serum: Secondary | ICD-10-CM

## 2016-09-03 LAB — ANTI-NUCLEAR AB-TITER (ANA TITER): ANA Titer 1: 1:80 {titer} — ABNORMAL HIGH

## 2016-09-03 LAB — RHEUMATOID FACTOR

## 2016-09-03 LAB — ANA: Anti Nuclear Antibody(ANA): POSITIVE — AB

## 2016-09-03 NOTE — Telephone Encounter (Signed)
  Referral to rheumatologist placed. 

## 2016-09-09 NOTE — Progress Notes (Signed)
    Referring-Gonzales, Clarence DredgeEdward, PA-C Reason for referral-FH CAD, CM; shoulder pain  HPI: 26 year old male for evaluation of left shoulder pain and family history of coronary artery disease and hypertrophic cardiomyopathy at request of Esperanza Richtersdward Saguier, PA-C. Patient recently started a vigorous exercise program after not exercising for 2 years. He developed some pain in his right shoulder and tightness in his head. He felt dizzy afterwards. He otherwise denies dyspnea on exertion, orthopnea, PND, pedal edema, palpitations, syncope or exertional chest pain. His grandmother has hypertrophic obstructive cardiomyopathy but he states his mother has not been screened.    No current outpatient prescriptions on file.   No current facility-administered medications for this visit.     Allergies  Allergen Reactions  . Amoxicillin Anaphylaxis  . Penicillins Anaphylaxis  . Latex Other (See Comments)    Senstivity      Past Medical History:  Diagnosis Date  . Asthma     Past Surgical History:  Procedure Laterality Date  . No previous surgery      Social History   Social History  . Marital status: Single    Spouse name: N/A  . Number of children: N/A  . Years of education: N/A   Occupational History  . Not on file.   Social History Main Topics  . Smoking status: Never Smoker  . Smokeless tobacco: Never Used  . Alcohol use 0.0 oz/week     Comment: Occasional  . Drug use: No  . Sexual activity: Not on file   Other Topics Concern  . Not on file   Social History Narrative  . No narrative on file    Family History  Problem Relation Age of Onset  . Diabetes Maternal Grandfather   . CAD Mother     ROS: no fevers or chills, productive cough, hemoptysis, dysphasia, odynophagia, melena, hematochezia, dysuria, hematuria, rash, seizure activity, orthopnea, PND, pedal edema, claudication. Remaining systems are negative.  Physical Exam:   Blood pressure 135/83, pulse 61, height  6\' 1"  (1.854 m), weight 112.5 kg (248 lb).  General:  Well developed/well nourished in NAD Skin warm/dry Patient not depressed No peripheral clubbing Back-normal HEENT-normal/normal eyelids Neck supple/normal carotid upstroke bilaterally; no bruits; no JVD; no thyromegaly chest - CTA/ normal expansion CV - RRR/normal S1 and S2; no murmurs, rubs or gallops;  PMI nondisplaced Abdomen -NT/ND, no HSM, no mass, + bowel sounds, no bruit 2+ femoral pulses, no bruits Ext-no edema, chords, 2+ DP Neuro-grossly nonfocal  ECG -09/02/2016-sinus rhythm with no ST changes.  personally reviewed  A/P  1 family history of hypertrophic obstructive cardiomyopathy-patient states his grandmother has this and she indeed is a patient of mine. He is unclear about his mother and he understands she needs to be screened. We will arrange an echocardiogram to evaluate this issue for him.  2 question hyperlipidemia-will check baseline lipids.  3 elevated blood pressure-patient's blood pressure has been intermittently elevated by his report. We discussed lifestyle modification including low sodium diet, exercise, weight loss and avoiding excessive alcohol use. I have asked him to check his blood pressure occasionally at home and we will add medications as needed.  4 right shoulder pain-this did not appear to be ischemic in etiology. We will not pursue further evaluation at this point.  Clarence MillersBrian Cordarryl Monrreal, MD

## 2016-09-15 ENCOUNTER — Encounter: Payer: Self-pay | Admitting: Cardiology

## 2016-09-16 ENCOUNTER — Encounter: Payer: Self-pay | Admitting: Cardiology

## 2016-09-16 ENCOUNTER — Ambulatory Visit (INDEPENDENT_AMBULATORY_CARE_PROVIDER_SITE_OTHER): Payer: 59 | Admitting: Cardiology

## 2016-09-16 VITALS — BP 135/83 | HR 61 | Ht 73.0 in | Wt 248.0 lb

## 2016-09-16 DIAGNOSIS — Z8279 Family history of other congenital malformations, deformations and chromosomal abnormalities: Secondary | ICD-10-CM

## 2016-09-16 DIAGNOSIS — E7800 Pure hypercholesterolemia, unspecified: Secondary | ICD-10-CM | POA: Diagnosis not present

## 2016-09-16 DIAGNOSIS — R030 Elevated blood-pressure reading, without diagnosis of hypertension: Secondary | ICD-10-CM | POA: Diagnosis not present

## 2016-09-16 DIAGNOSIS — M25511 Pain in right shoulder: Secondary | ICD-10-CM

## 2016-09-16 NOTE — Patient Instructions (Signed)

## 2016-09-17 LAB — LIPID PANEL
CHOLESTEROL TOTAL: 204 mg/dL — AB (ref 100–199)
Chol/HDL Ratio: 4.9 ratio (ref 0.0–5.0)
HDL: 42 mg/dL (ref >39)
LDL Calculated: 134 mg/dL — ABNORMAL HIGH (ref 0–99)
TRIGLYCERIDES: 140 mg/dL (ref 0–149)
VLDL Cholesterol Cal: 28 mg/dL (ref 5–40)

## 2016-10-07 ENCOUNTER — Ambulatory Visit (HOSPITAL_BASED_OUTPATIENT_CLINIC_OR_DEPARTMENT_OTHER)
Admission: RE | Admit: 2016-10-07 | Discharge: 2016-10-07 | Disposition: A | Discharge status: Home / Self Care | Payer: 59 | Source: Ambulatory Visit | Attending: Cardiology | Admitting: Cardiology

## 2016-10-07 DIAGNOSIS — K219 Gastro-esophageal reflux disease without esophagitis: Secondary | ICD-10-CM | POA: Diagnosis not present

## 2016-10-07 DIAGNOSIS — E785 Hyperlipidemia, unspecified: Secondary | ICD-10-CM | POA: Diagnosis not present

## 2016-10-07 DIAGNOSIS — Z8279 Family history of other congenital malformations, deformations and chromosomal abnormalities: Secondary | ICD-10-CM | POA: Diagnosis not present

## 2016-10-07 DIAGNOSIS — F419 Anxiety disorder, unspecified: Secondary | ICD-10-CM | POA: Diagnosis not present

## 2016-10-07 DIAGNOSIS — M25511 Pain in right shoulder: Secondary | ICD-10-CM | POA: Diagnosis not present

## 2016-11-23 ENCOUNTER — Ambulatory Visit: Payer: Self-pay | Admitting: Allergy and Immunology

## 2017-03-23 ENCOUNTER — Encounter (HOSPITAL_BASED_OUTPATIENT_CLINIC_OR_DEPARTMENT_OTHER): Payer: Self-pay | Admitting: *Deleted

## 2017-03-23 ENCOUNTER — Other Ambulatory Visit: Payer: Self-pay

## 2017-03-23 ENCOUNTER — Emergency Department (HOSPITAL_BASED_OUTPATIENT_CLINIC_OR_DEPARTMENT_OTHER)
Admission: EM | Admit: 2017-03-23 | Discharge: 2017-03-23 | Disposition: A | Discharge status: Home / Self Care | Payer: 59 | Attending: Emergency Medicine | Admitting: Emergency Medicine

## 2017-03-23 DIAGNOSIS — R21 Rash and other nonspecific skin eruption: Secondary | ICD-10-CM | POA: Diagnosis not present

## 2017-03-23 DIAGNOSIS — Z91040 Latex allergy status: Secondary | ICD-10-CM | POA: Insufficient documentation

## 2017-03-23 DIAGNOSIS — L282 Other prurigo: Secondary | ICD-10-CM | POA: Insufficient documentation

## 2017-03-23 DIAGNOSIS — L509 Urticaria, unspecified: Secondary | ICD-10-CM | POA: Diagnosis present

## 2017-03-23 DIAGNOSIS — J45909 Unspecified asthma, uncomplicated: Secondary | ICD-10-CM | POA: Insufficient documentation

## 2017-03-23 MED ORDER — HYDROXYZINE HCL 25 MG PO TABS: 25.0000 mg | ORAL_TABLET | Freq: Four times a day (QID) | ORAL | 0 refills | Status: DC

## 2017-03-23 MED ORDER — PERMETHRIN 5 % EX CREA: TOPICAL_CREAM | CUTANEOUS | 1 refills | Status: DC

## 2017-03-23 NOTE — ED Triage Notes (Signed)
Hives x 2 weeks. Worse the last few days.

## 2017-03-23 NOTE — ED Provider Notes (Signed)
[facility] EMERGENCY DEPARTMENT Provider Note   CSN: [id number] Arrival date & time: 03/23/17  1827     History   Chief Complaint Chief Complaint  Patient presents with  . Urticaria    HPI Clarence Gonzales is a 26 y.o. male.  Patient presents with an extremely pruritic rash present for 1.5 weeks. Pt describes rash as small red bumps present on wrists, in between fingers, at waist line, over elbows and on legs, and trunk. Pt first noticed rash on his wrists. No new household products or medications. Pt lives with his grandmother, and she is not complaining of a rash. No known bug bites, pt denies any recent outdoor activities. Pt has tried benadryl for the itching with minimal improvement. Since rash started he has tried changing soaps and detergents as well with no improvement.  HPI  Past Medical History:  Diagnosis Date  . Asthma     Patient Active Problem List   Diagnosis Date Noted  . 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 History of sinusitis 05/15/2015    Past Surgical History:  Procedure Laterality Date  . No previous surgery         Home Medications    Prior to Admission medications   Not on File    Family History Family History  Problem Relation Age of Onset  . Diabetes Maternal Grandfather   . CAD Mother     Social History Social History   Tobacco Use  . Smoking status: Never Smoker  . Smokeless tobacco: Never Used  Substance Use Topics  . Alcohol use: Yes    Alcohol/week: 0.0 oz    Comment: Occasional  . Drug use: No     Allergies   Amoxicillin; Penicillins; and Latex   Review of Systems Review of Systems  Constitutional: Negative for chills and fever.  HENT: Negative for facial swelling.   Eyes: Negative for itching.  Respiratory: Negative for chest tightness, shortness of breath and wheezing.   Cardiovascular: Negative for chest pain.  Gastrointestinal: Negative for abdominal pain, nausea and vomiting.  Musculoskeletal:  Negative for arthralgias and myalgias.  Skin: Positive for rash.  Neurological: Negative for dizziness and headaches.     Physical Exam Updated Vital Signs BP (!) 141/82   Pulse 69   Temp 98.4 F (36.9 C) (Oral)   Resp 16   Ht 6' 1.5" (1.867 m)   Wt 108.9 kg (240 lb)   SpO2 99%   BMI 31.23 kg/m   Physical Exam  Constitutional: He appears well-developed and well-nourished. No distress.  HENT:  Head: Normocephalic and atraumatic.  Eyes: Right eye exhibits no discharge. Left eye exhibits no discharge.  Pulmonary/Chest: Effort normal. No respiratory distress.  Neurological: He is alert. Coordination normal.  Skin: Skin is warm and dry. Capillary refill takes less than 2 seconds. Rash noted. He is not diaphoretic.  Small erythematous papules scattered across bilateral wrists, a few lesions noted in between fingers, scattered across the chest, at the waistline and on the lower extremities, some excoriations present over the rash, no surrounding erythema to suggest infection, no pustules, petechiae, no desquamation  Psychiatric: He has a normal mood and affect. His behavior is normal.  Nursing note and vitals reviewed.    ED Treatments / Results  Labs (all labs ordered are listed, but only abnormal results are displayed) Labs Reviewed - No data to display  EKG  EKG Interpretation None       Radiology No results found.  Procedures Procedures (including  critical care time)  Medications Ordered in ED Medications - No data to display   Initial Impression / Assessment and Plan / ED Course  I have reviewed the triage vital signs and the nursing notes.  Pertinent labs & imaging results that were available during my care of the patient were reviewed by me and considered in my medical decision making (see chart for details).  Patient presents with extremely pruritic rash on presentation suggestive of scabies. Patient denies any difficulty breathing or swallowing.  Pt has a  patent airway without stridor and is handling secretions without difficulty; no angioedema. No blisters, no pustules, no warmth, no superficial abscesses, no desquamation, no target lesions. Not tender to touch. No concern for superimposed infection. No concern for SJS, TEN, TSS, or other life-threatening condition.  Discussed diagnosis & treatment of scabies with pt.  They have been advised to followup with her primary care doctor 2 weeks after treatment. Counseled pt to clean entire household including washing sheets and using Nix spray in the car and on sofa.   The use of permethrin cream was discussed as well, they were told to use cream from head to toe & leave on for 8-12 hours.  They've been advised to repeat treatment in 1 week. Hydroxyzine for itching. Patient verbalized understanding.    Final Clinical Impressions(s) / ED Diagnoses   Final diagnoses:  Pruritic rash    ED Discharge Orders        Ordered    permethrin (ELIMITE) 5 % cream     03/23/17 2148    hydrOXYzine (ATARAX/VISTARIL) 25 MG tablet  Every 6 hours     03/23/17 2148       Dartha LodgeFord, Kelsey N, PA-C 03/24/17 1227    Doug SouJacubowitz, Sam, MD 03/25/17 [phone]

## 2017-03-23 NOTE — ED Notes (Signed)
ED Provider at bedside. 

## 2017-03-23 NOTE — Discharge Instructions (Signed)
Wash all clothing & linens in hot water.  Spray all upholstered surfaces with Nix spray.  Be sure the cream is on the skin for at least 8 hours.  Most people sleep in it over night & wash it off in the morning. You may repeat treatment in 1 week if there is no improvement. Follow up with your PCP if rash not improving. You may take hydroxyzine for itching.

## 2017-03-24 MED FILL — PERMETHRIN 5% CREAM: 5 | 1 days supply | Qty: 60 | Fill #0

## 2017-03-24 MED FILL — hydrOXYzine HCL 25 MG TABS: 25 | 3 days supply | Qty: 12 | Fill #0

## 2017-08-01 IMAGING — CR DG ANKLE COMPLETE 3+V*L*
3 series · 3 of 3 positions shown · non-contrast
Comparison: None.

CLINICAL DATA: Twisting injury left ankle coming down stairs
tonight with lateral malleolar pain and swelling.

EXAM:
LEFT ANKLE COMPLETE - 3+ VIEW

[t ankle joint ap left]
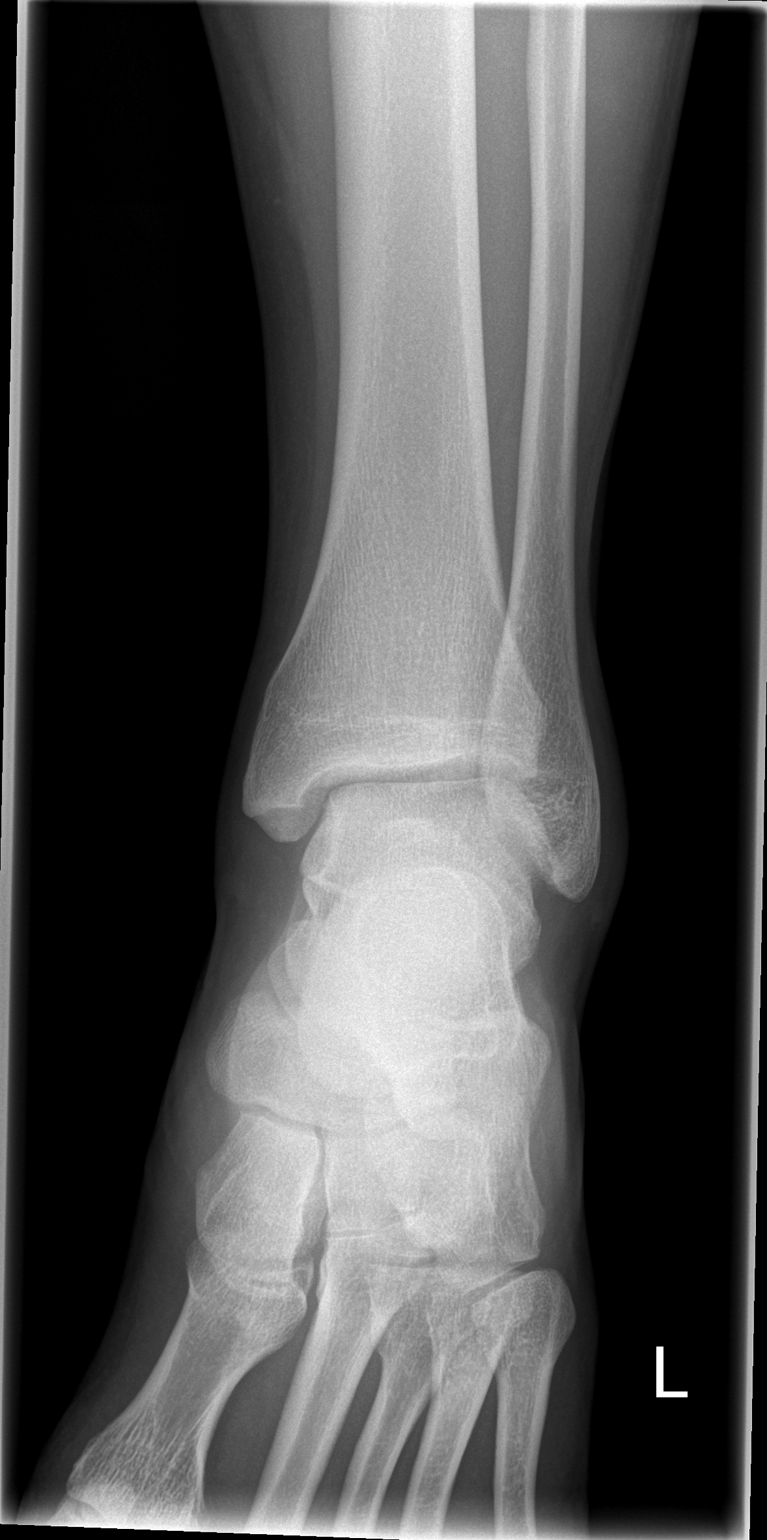

[t ankle joint oblique left]
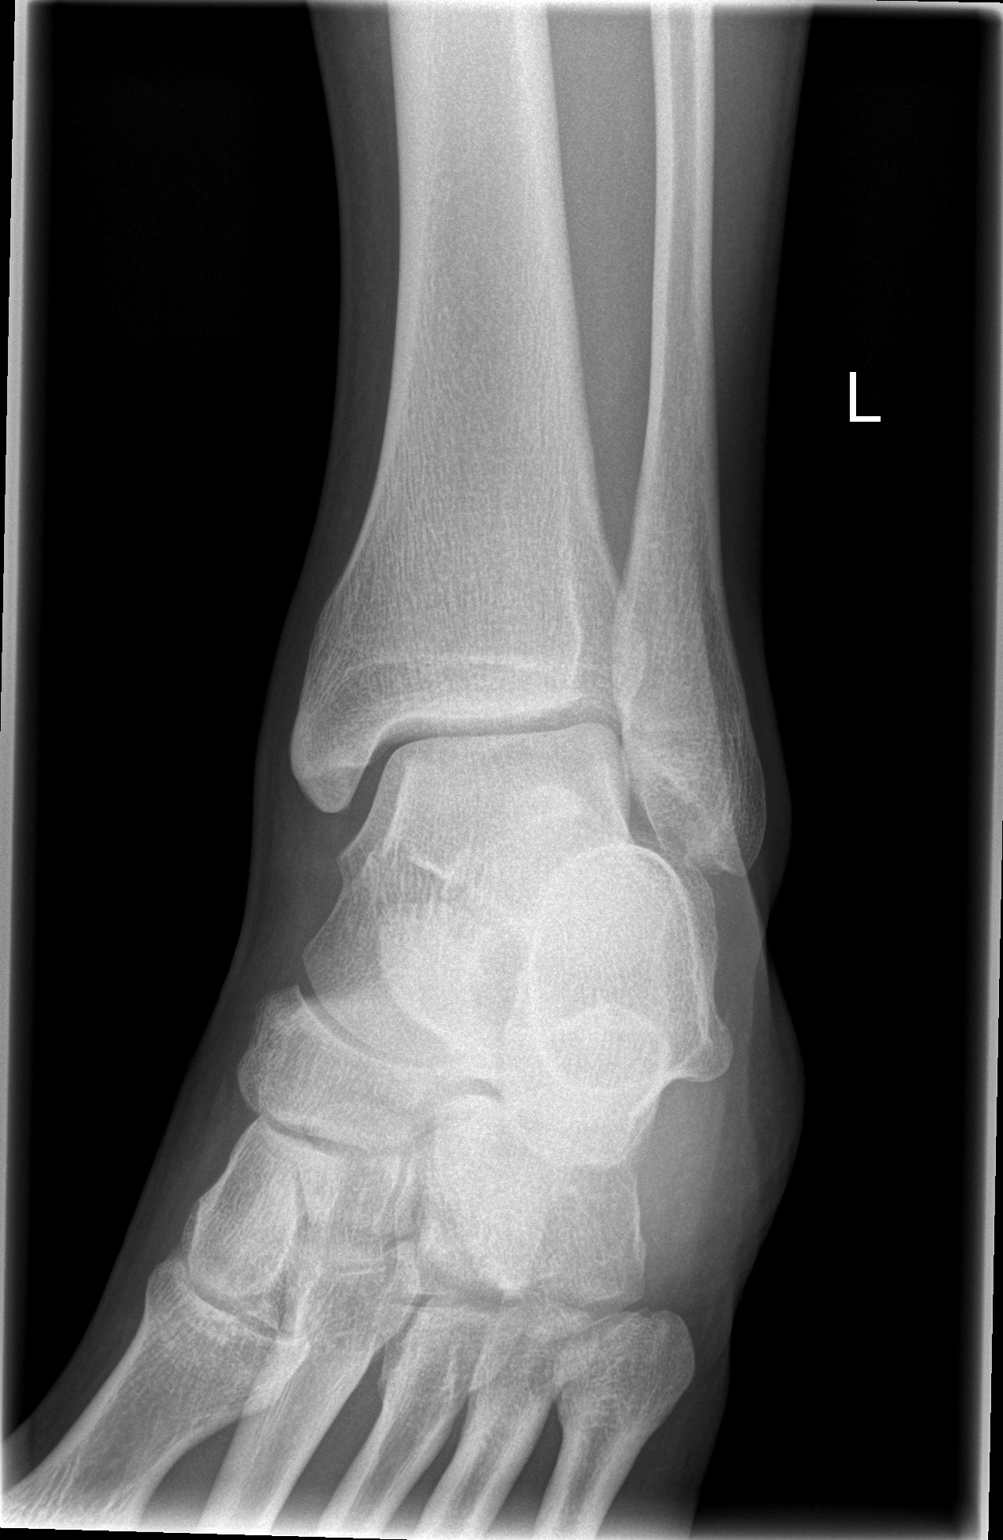

[t ankle joint lat left]
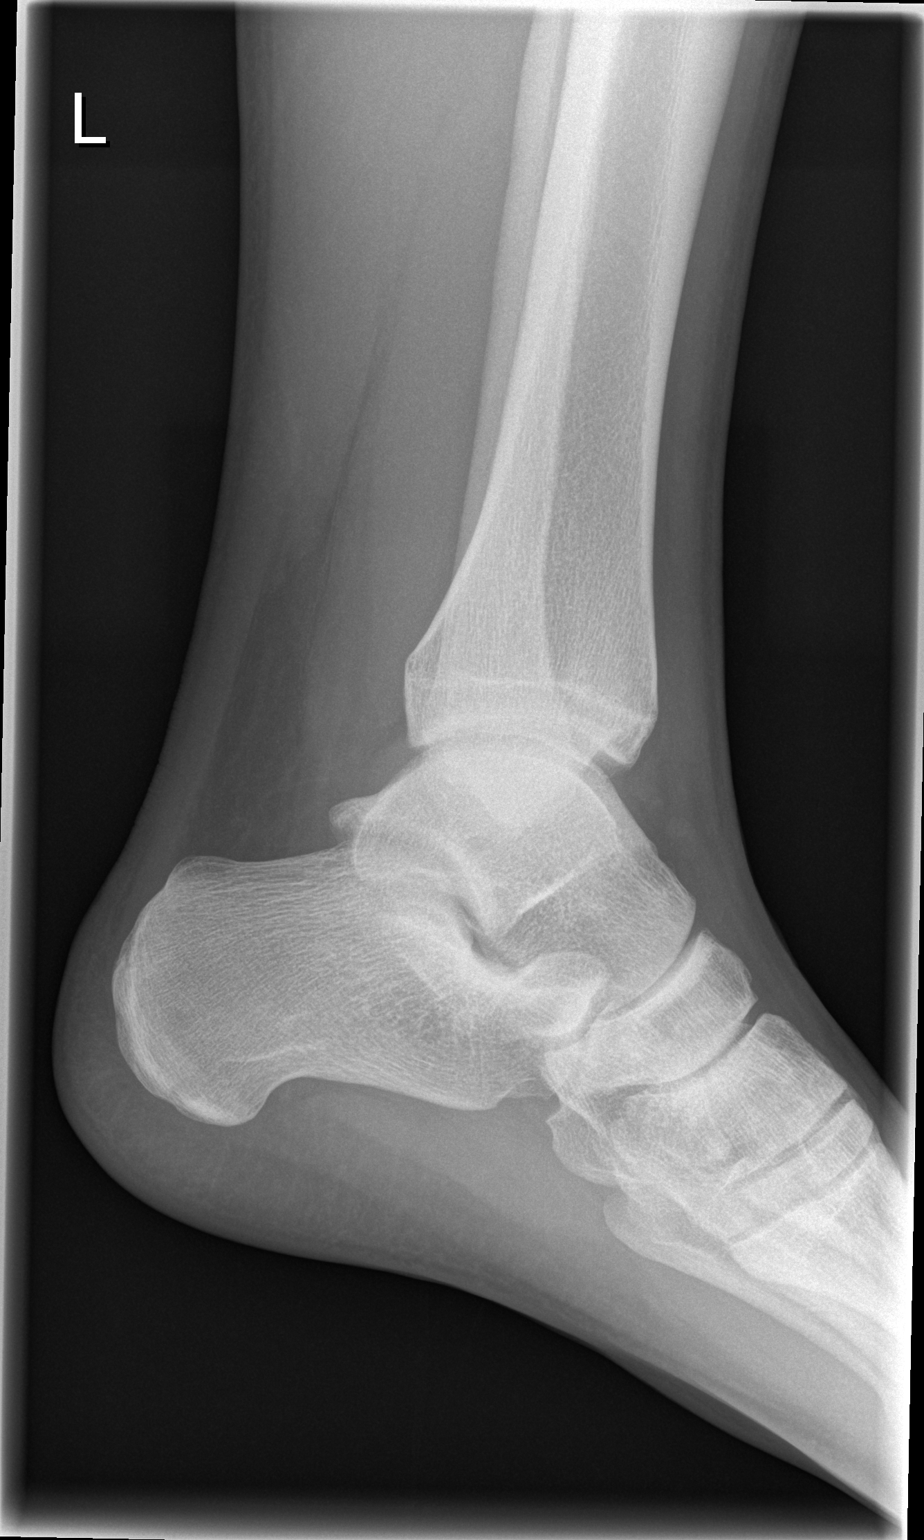

[3 of 3 positions shown; findings below may reference images not displayed]

FINDINGS: There is no evidence of fracture, dislocation, or joint effusion.
There is no evidence of arthropathy or other focal bone abnormality.
Soft tissues are unremarkable.
IMPRESSION: Negative.

## 2017-11-10 ENCOUNTER — Telehealth: Payer: Self-pay

## 2017-11-10 NOTE — Telephone Encounter (Signed)
Copied from CRM [phone]. Topic: General - Other >> Nov 09, 2017  3:02 PM Elliot GaultBell, Tiffany M wrote: Relation to pt: self  Call back number: [phone]    Reason for call:  Patient requesting a referral to ENT due to water in ear and requesting eardrum lanced, patient unsure if PCP can conduct in office, please advise

## 2017-11-10 NOTE — Telephone Encounter (Addendum)
Pt needs appointment. Do I have anything available tomorrow or Friday. I won't be doing any procedure on his ear drum etc. But may give medication. May be able to help  with symptoms and then can refer to ent if needed. Being seen will help him get in more timely manner. Without seeing him referral may take very long time. Usually specialist will review note about condition then determined when they can see.   Routed to jasmine earlier. Not sure if she called pt.

## 2017-11-10 NOTE — Telephone Encounter (Signed)
Patient is calling and would like to know does he need to come into the office and get this taken care of or if the ENT referral would be placed. He would like a call back today if possible.

## 2017-11-11 ENCOUNTER — Encounter: Payer: Self-pay | Admitting: Medical

## 2017-11-11 ENCOUNTER — Ambulatory Visit (INDEPENDENT_AMBULATORY_CARE_PROVIDER_SITE_OTHER): Payer: 59 | Admitting: Medical

## 2017-11-11 VITALS — BP 132/80 | HR 68 | Temp 97.9°F | Resp 16 | Ht 73.5 in | Wt 261.8 lb

## 2017-11-11 DIAGNOSIS — H9201 Otalgia, right ear: Secondary | ICD-10-CM | POA: Diagnosis not present

## 2017-11-11 DIAGNOSIS — H6690 Otitis media, unspecified, unspecified ear: Secondary | ICD-10-CM

## 2017-11-11 DIAGNOSIS — H9311 Tinnitus, right ear: Secondary | ICD-10-CM | POA: Diagnosis not present

## 2017-11-11 MED ORDER — FLUTICASONE PROPIONATE 50 MCG/ACT NA SUSP: 2.0000 | Freq: Every day | NASAL | 1 refills | Status: DC

## 2017-11-11 MED ORDER — CLOTRIMAZOLE-BETAMETHASONE 1-0.05 % EX CREA: 1.0000 "application " | TOPICAL_CREAM | Freq: Two times a day (BID) | CUTANEOUS | 0 refills | Status: DC

## 2017-11-11 MED FILL — CLOTRIMAZOLE-BETAMETHASONE: 1-0.05 | 15 days supply | Qty: 30 | Fill #0

## 2017-11-11 NOTE — Patient Instructions (Addendum)
You do appear to have ear infection and the pain you had on the weekend has subsided.  This might be slow resolving ear infection.  Clindamycin is generally considered very strong antibiotic and you do have some allergy restrictions.  Considered Rocephin injection antibiotic but your anaphylactic type reaction to penicillin prohibits this.  If you have any recurrent pain as you had over the weekend then please let me know and I would consider switching you to Levaquin at that point.  Presently will put in referral to ENT/Dr. Jenne PaneBates.  If there is significant delay in seeing him then would try to call around for you to see other ENTs.  I do not think it is necessary for you to use the eardrops presently as your canal does not appear inflamed.  I will prescribe Flonase nasal spray.  If you have any component of eustachian tube dysfunction this might help.  For your rash on your waist region, I prescribed Lotrisone cream.  Also would recommend that you use a large square Band-Aid over area to prevent friction from your belt.  Follow-up in 7 to 10 days or as needed.

## 2017-11-11 NOTE — Telephone Encounter (Signed)
Author phoned pt. To set up appointment per Esperanza RichtersEdward Saguier, PA's recommendation. Unable to reach pt, or emergency contact, or mother, suzanne. Will route to [place], CMA, to attempt to contact pt. later.

## 2017-11-11 NOTE — Progress Notes (Signed)
Subjective:    Patient ID: Clarence Gonzales, male    DOB: 09/04/1990, 27 y.o.   MRN: [id number]  HPI  Pt in with rt side ear pain after [place] Immediately felt sensation of water in ear.   He states frustrated with sensation and then pain started. [facility] went to [facility]. They gave him ear drops.   He states [facility] He started antibioitic and then note by Sunday night no pain in ear. But slight pressure sensation in ear has peristed. And has mild tinnitus. Pt given ear drops and clindamycin. He stopped using drops but continues with clindamycin.  Pt also had left axillary skin rash some rash around waist. He notes this is wear belt overlays area on left side abdoemen. Started as small  bump but now larger.   He mentioned skin issues to [facility]. Pt states he was tol clindamyin can help with skin as well.   He has been on antibiotic for 4 days.   Pt states symptoms are not getting better. He wants to be referred to ent. Some ringing to his ear. Last time he had pain was Saturday.   Review of Systems  Constitutional: Negative for chills, fatigue and fever.  HENT: Positive for congestion. Negative for ear pain.        Ear pressure. Mild ringing.   Has mild nasal congestion.  Respiratory: Negative for cough, chest tightness, shortness of breath and wheezing.   Cardiovascular: Negative for chest pain and palpitations.  Gastrointestinal: Negative for abdominal distention and abdominal pain.  Musculoskeletal: Negative for back pain.  Skin: Positive for rash.  Hematological: Negative for adenopathy. Does not bruise/bleed easily.  Psychiatric/Behavioral: Negative for behavioral problems, decreased concentration, dysphoric mood and sleep disturbance. The patient is not nervous/anxious.     Past Medical History:  Diagnosis Date  . Asthma      Social History   Socioeconomic History  . Marital status: Single    Spouse name: Not  on file  . Number of children: Not on file  . Years of education: Not on file  . Highest education level: Not on file  Occupational History  . Not on file  Social Needs  . Financial resource strain: Not on file  . Food insecurity:    Worry: Not on file    Inability: Not on file  . Transportation needs:    Medical: Not on file    Non-medical: Not on file  Tobacco Use  . Smoking status: Never Smoker  . Smokeless tobacco: Never Used  Substance and Sexual Activity  . Alcohol use: Yes    Alcohol/week: 0.0 oz    Comment: Occasional  . Drug use: No  . Sexual activity: Not on file  Lifestyle  . Physical activity:    Days per week: Not on file    Minutes per session: Not on file  . Stress: Not on file  Relationships  . Social connections:    Talks on phone: Not on file    Gets together: Not on file    Attends religious service: Not on file    Active member of club or organization: Not on file    Attends meetings of clubs or organizations: Not on file    Relationship status: Not on file  . Intimate partner violence:    Fear of current or ex partner: Not on file    Emotionally abused: Not on file  Physically abused: Not on file    Forced sexual activity: Not on file  Other Topics Concern  . Not on file  Social History Narrative  . Not on file    Past Surgical History:  Procedure Laterality Date  . No previous surgery      Family History  Problem Relation Age of Onset  . Diabetes Maternal Grandfather   . CAD Mother     Allergies  Allergen Reactions  . Amoxicillin Anaphylaxis  . Penicillins Anaphylaxis  . Latex Other (See Comments)    Senstivity     Current Outpatient Medications on File Prior to Visit  Medication Sig Dispense Refill  . clindamycin (CLEOCIN) 300 MG capsule Take 300 mg by mouth 4 (four) times daily.  0  . neomycin-polymyxin-hydrocortisone (CORTISPORIN) 3.5-10000-1 OTIC suspension INT 4 GTS AEA QID  1   No current facility-administered  medications on file prior to visit.     BP 132/80 (BP Location: Right Arm, Patient Position: Sitting, Cuff Size: Large)   Pulse 68   Temp 97.9 F (36.6 C) (Oral)   Resp 16   Ht 6' 1.5" (1.867 m)   Wt 261 lb 12.8 oz (118.8 kg)   SpO2 98%   BMI 34.07 kg/m       Objective:   Physical Exam  General  Mental Status - Alert. General Appearance - Well groomed. Not in acute distress.  Skin Left side abdomen/at belt line 2.5 cm x 4 cm(approximate) mild raised area with faint lichinfied appearance.   HEENT Head- Normal. Ear Auditory Canal - Left- Normal. Right - Normal.Tympanic Membrane- Left- Normal. Right- mild redness to rt tm. No tragal tenderness. No mastoid area pain. Eye Sclera/Conjunctiva- Left- Normal. Right- Normal. Nose & Sinuses Nasal Mucosa- Left-  Boggy and Congested. Right-  Boggy and  Congested.Bilateral no  maxillary and  No frontal sinus pressure. Mouth & Throat Lips: Upper Lip- Normal: no dryness, cracking, pallor, cyanosis, or vesicular eruption. Lower Lip-Normal: no dryness, cracking, pallor, cyanosis or vesicular eruption. Buccal Mucosa- Bilateral- No Aphthous ulcers. Oropharynx- No Discharge or Erythema. Tonsils: Characteristics- Bilateral- No Erythema or Congestion. Size/Enlargement- Bilateral- No enlargement. Discharge- bilateral-None.  Neck Neck- Supple. No Masses.   Chest and Lung Exam Auscultation: Breath Sounds:-Clear even and unlabored.  Cardiovascular Auscultation:Rythm- Regular, rate and rhythm. Murmurs & Other Heart Sounds:Ausculatation of the heart reveal- No Murmurs.  Lymphatic Head & Neck General Head & Neck Lymphatics: Bilateral: Description- No Localized lymphadenopathy.       Assessment & Plan:  You do appear to have ear infection and the pain you had on the weekend has subsided.  This might be slow resolving ear infection.  Clindamycin is generally considered very strong antibiotic and you do have some allergy restrictions.   Considered Rocephin injection antibiotic but your anaphylactic type reaction to penicillin prohibits this.  If you have any recurrent pain as you had over the weekend then please let me know and I would consider switching you to Levaquin at that point.  Presently will put in referral to ENT/Dr. Jenne Pane.  If there is significant delay in seeing him then would try to call around for you to see other ENTs.  I do not think it is necessary for you to use the eardrops presently as your canal does not appear inflamed.  I will prescribe Flonase nasal spray.  If you have any component of eustachian tube dysfunction this might help.  For your rash on your waist region, I prescribed Lotrisone cream.  Also would recommend that you use a large square Band-Aid over area to prevent friction from your belt.  Follow-up in 7 to 10 days or as needed.  Esperanza Richters, PA-C

## 2017-11-11 NOTE — Telephone Encounter (Signed)
Spoke with patient he will be in this afternoon.  appt made for 4pm

## 2017-11-16 ENCOUNTER — Encounter: Payer: Self-pay | Admitting: Medical

## 2017-11-19 ENCOUNTER — Telehealth: Payer: Self-pay | Admitting: Medical

## 2017-11-19 NOTE — Telephone Encounter (Signed)
Will you check on status of ENT referral. Pt sent me request on update.

## 2017-11-20 ENCOUNTER — Telehealth: Payer: Self-pay | Admitting: Medical

## 2017-11-20 ENCOUNTER — Encounter: Payer: Self-pay | Admitting: Medical

## 2017-11-20 NOTE — Telephone Encounter (Signed)
Will you let pt know we did call ENT office and they state they called him and left a message. Also when we called them on Friday they made comment they might have his last name spelled wrong. So recommend call and double check with them that they have last name correct spelling.    Please have pt call [phone] to schedule

## 2017-11-22 NOTE — Telephone Encounter (Signed)
Left pt a message to call back. 

## 2017-11-22 NOTE — Telephone Encounter (Signed)
Pt. returned phone call to update on ENT referral status per Edward's request. Pt. stated he has an appointment for 8/22 with ENT to evaluate poor hearing in R ear. Pt. was concerned that the appointment was too far out. [name], PA made aware and to advise.

## 2017-11-23 NOTE — Telephone Encounter (Signed)
Left message to call back about ENT referral.

## 2017-12-29 MED FILL — FLUCONAZOLE 150 MG TABS: 150 | 28 days supply | Qty: 4 | Fill #0

## 2017-12-29 MED FILL — TOLNAFTATE 1 % POWD: 1 | 30 days supply | Qty: 45 | Fill #0

## 2018-01-07 ENCOUNTER — Encounter: Payer: Self-pay | Admitting: Medical

## 2018-01-11 ENCOUNTER — Telehealth: Payer: Self-pay | Admitting: Medical

## 2018-01-11 DIAGNOSIS — L309 Dermatitis, unspecified: Secondary | ICD-10-CM

## 2018-01-11 NOTE — Telephone Encounter (Signed)
Referral to dermatologist placed. 

## 2018-04-07 ENCOUNTER — Ambulatory Visit: Payer: Self-pay

## 2018-04-07 MED FILL — DOXYCYCLINE MONO 100 MG CAP: 100 | 7 days supply | Qty: 14 | Fill #0

## 2018-04-07 NOTE — Telephone Encounter (Signed)
Patient called in with c/o "sinus pain/pressure." He says "I called to be ordered the same antibiotic I took before for this. I always have these sinus problems since I was younger and always end up on a 14 day antibiotic." I advised he will need to be seen in the office. I asked about the pain, he says "it is in my cheeks. I woke up yesterday with a little pressure, but today it's worse. I have pain at a 3. My nose is stopped up, going from side to side. I was blowing out clear 2 days ago, then yesterday it was yellow and today a greenish color." I asked about a fever, he says "I normally don't run a fever, but run about 1-2 degrees above normal. It is running 98.9-99.0 this morning." I asked about other symptoms, he says "sore throat a little, a little cough, right ear ache." According to protocol, see PCP within 24 hours. He says "if I can't be seen today, I will just do an e-visit, because I am going out of town traveling in a week or so and need to be on the antibiotics today to start them working." I advised no appointments today with PCP or any provider in the practice. He says "I will do an e-visit. What is the name of the antibiotic I was on last, Clindamycin?" I advised Clindamycin 300 mg QID, but no quantity listed. I advised to call the pharmacy to verify. He says "that's what I need to know, because z-pack's don't usually work for me." Care advice given, patient verbalized understanding. I advised to call and follow up with [name] if his symptoms do not improve on the antibiotics, he verbalized understanding.    Reason for Disposition . [1] Sinus pain (not just congestion) AND [2] fever  Answer Assessment - Initial Assessment Questions 1. LOCATION: "Where does it hurt?"      Cheeks 2. ONSET: "When did the sinus pain start?"  (e.g., hours, days)      Woke up yesterday with pressure in face, worse today 3. SEVERITY: "How bad is the pain?"   (Scale 1-10; mild, moderate or severe)   - MILD  (1-3): doesn't interfere with normal activities    - MODERATE (4-7): interferes with normal activities (e.g., work or school) or awakens from sleep   - SEVERE (8-10): excruciating pain and patient unable to do any normal activities        3 4. RECURRENT SYMPTOM: "Have you ever had sinus problems before?" If so, ask: "When was the last time?" and "What happened that time?"      Yes 5. NASAL CONGESTION: "Is the nose blocked?" If so, ask, "Can you open it or must you breathe through the mouth?"     Yes, goes from side to side 6. NASAL DISCHARGE: "Do you have discharge from your nose?" If so ask, "What color?"    Greenish today, yesterday yellow, 2 days ago clear 7. FEVER: "Do you have a fever?" If so, ask: "What is it, how was it measured, and when did it start?"      98.9-99 this morning 8. OTHER SYMPTOMS: "Do you have any other symptoms?" (e.g., sore throat, cough, earache, difficulty breathing)     Sore throat, cough, earache on right side 9. PREGNANCY: "Is there any chance you are pregnant?" "When was your last menstrual period?"     N/A  Protocols used: SINUS PAIN OR CONGESTION-A-AH

## 2018-04-11 NOTE — Progress Notes (Deleted)
      HPI: FU family history of coronary artery disease and hypertrophic cardiomyopathy.   Echocardiogram June 2018 showed normal LV systolic function, mild left ventricular enlargement, mild LVH.  Since last seen  Current Outpatient Medications  Medication Sig Dispense Refill  . clindamycin (CLEOCIN) 300 MG capsule Take 300 mg by mouth 4 (four) times daily.  0  . clotrimazole-betamethasone (LOTRISONE) cream Apply 1 application topically 2 (two) times daily. 30 g 0  . fluticasone (FLONASE) 50 MCG/ACT nasal spray Place 2 sprays into both nostrils daily. 16 g 1  . neomycin-polymyxin-hydrocortisone (CORTISPORIN) 3.5-10000-1 OTIC suspension INT 4 GTS AEA QID  1   No current facility-administered medications for this visit.      Past Medical History:  Diagnosis Date  . Asthma     Past Surgical History:  Procedure Laterality Date  . No previous surgery      Social History   Socioeconomic History  . Marital status: Single    Spouse name: Not on file  . Number of children: Not on file  . Years of education: Not on file  . Highest education level: Not on file  Occupational History  . Not on file  Social Needs  . Financial resource strain: Not on file  . Food insecurity:    Worry: Not on file    Inability: Not on file  . Transportation needs:    Medical: Not on file    Non-medical: Not on file  Tobacco Use  . Smoking status: Never Smoker  . Smokeless tobacco: Never Used  Substance and Sexual Activity  . Alcohol use: Yes    Alcohol/week: 0.0 standard drinks    Comment: Occasional  . Drug use: No  . Sexual activity: Not on file  Lifestyle  . Physical activity:    Days per week: Not on file    Minutes per session: Not on file  . Stress: Not on file  Relationships  . Social connections:    Talks on phone: Not on file    Gets together: Not on file    Attends religious service: Not on file    Active member of club or organization: Not on file    Attends meetings of  clubs or organizations: Not on file    Relationship status: Not on file  . Intimate partner violence:    Fear of current or ex partner: Not on file    Emotionally abused: Not on file    Physically abused: Not on file    Forced sexual activity: Not on file  Other Topics Concern  . Not on file  Social History Narrative  . Not on file    Family History  Problem Relation Age of Onset  . Diabetes Maternal Grandfather   . CAD Mother     ROS: no fevers or chills, productive cough, hemoptysis, dysphasia, odynophagia, melena, hematochezia, dysuria, hematuria, rash, seizure activity, orthopnea, PND, pedal edema, claudication. Remaining systems are negative.  Physical Exam: Well-developed well-nourished in no acute distress.  Skin is warm and dry.  HEENT is normal.  Neck is supple.  Chest is clear to auscultation with normal expansion.  Cardiovascular exam is regular rate and rhythm.  Abdominal exam nontender or distended. No masses palpated. Extremities show no edema. neuro grossly intact  ECG- personally reviewed  A/P  1  Clarence Gonzales , MD

## 2018-04-18 ENCOUNTER — Ambulatory Visit: Payer: 59 | Admitting: Medical

## 2018-04-18 DIAGNOSIS — Z0289 Encounter for other administrative examinations: Secondary | ICD-10-CM

## 2018-04-20 ENCOUNTER — Ambulatory Visit: Payer: 59 | Admitting: Cardiology

## 2018-04-25 ENCOUNTER — Ambulatory Visit: Payer: 59 | Admitting: Medical

## 2018-04-25 ENCOUNTER — Other Ambulatory Visit (INDEPENDENT_AMBULATORY_CARE_PROVIDER_SITE_OTHER): Payer: 59

## 2018-04-25 ENCOUNTER — Telehealth: Payer: Self-pay | Admitting: Medical

## 2018-04-25 ENCOUNTER — Other Ambulatory Visit (HOSPITAL_COMMUNITY)
Admission: RE | Admit: 2018-04-25 | Discharge: 2018-04-25 | Disposition: A | Discharge status: Home / Self Care | Payer: 59 | Source: Ambulatory Visit | Attending: Medical | Admitting: Medical

## 2018-04-25 DIAGNOSIS — Z113 Encounter for screening for infections with a predominantly sexual mode of transmission: Secondary | ICD-10-CM

## 2018-04-25 NOTE — Addendum Note (Signed)
Addended by: Mervin KungFERGERSON, Januel Doolan A on: 04/25/2018 01:39 PM   Modules accepted: Orders

## 2018-04-25 NOTE — Telephone Encounter (Signed)
Pt showed up and did not want office visit but appointment was made.  Staff called me stated he just wanted std testing. He did not want to be seen only have testing done.  Unusual so I called him directly and he stated he had no std symptoms and no recent worrisome sexual contact. So I put in ancillary urine studies and blood work. Stated he can go to lab.

## 2018-04-25 NOTE — Telephone Encounter (Signed)
Opened to review 

## 2018-04-25 NOTE — Telephone Encounter (Signed)
Opened second time by accident.

## 2018-04-25 NOTE — Addendum Note (Signed)
Addended by: Crissie SicklesARTER, Kerrin Markman A on: 04/25/2018 01:17 PM   Modules accepted: Orders

## 2018-04-26 LAB — URINE CYTOLOGY ANCILLARY ONLY
Chlamydia: NEGATIVE
Neisseria Gonorrhea: NEGATIVE
Trichomonas: NEGATIVE

## 2018-04-26 LAB — HIV ANTIBODY (ROUTINE TESTING W REFLEX): HIV: NONREACTIVE

## 2018-04-26 LAB — RPR: RPR: NONREACTIVE

## 2018-05-20 NOTE — Progress Notes (Signed)
HPI: Follow-up chest pain, family history of coronary disease and family history of hypertrophic cardiomyopathy.  Echocardiogram June 2018 showed normal LV systolic function, mild left ventricular hypertrophy and mild left atrial enlargement.  Since last seen the patient denies any dyspnea on exertion, orthopnea, PND, pedal edema, palpitations, syncope or chest pain.   No current outpatient medications on file.   No current facility-administered medications for this visit.      Past Medical History:  Diagnosis Date  . Asthma     Past Surgical History:  Procedure Laterality Date  . No previous surgery      Social History   Socioeconomic History  . Marital status: Single    Spouse name: Not on file  . Number of children: Not on file  . Years of education: Not on file  . Highest education level: Not on file  Occupational History  . Not on file  Social Needs  . Financial resource strain: Not on file  . Food insecurity:    Worry: Not on file    Inability: Not on file  . Transportation needs:    Medical: Not on file    Non-medical: Not on file  Tobacco Use  . Smoking status: Never Smoker  . Smokeless tobacco: Never Used  Substance and Sexual Activity  . Alcohol use: Yes    Alcohol/week: 0.0 standard drinks    Comment: Occasional  . Drug use: No  . Sexual activity: Not on file  Lifestyle  . Physical activity:    Days per week: Not on file    Minutes per session: Not on file  . Stress: Not on file  Relationships  . Social connections:    Talks on phone: Not on file    Gets together: Not on file    Attends religious service: Not on file    Active member of club or organization: Not on file    Attends meetings of clubs or organizations: Not on file    Relationship status: Not on file  . Intimate partner violence:    Fear of current or ex partner: Not on file    Emotionally abused: Not on file    Physically abused: Not on file    Forced sexual activity:  Not on file  Other Topics Concern  . Not on file  Social History Narrative  . Not on file    Family History  Problem Relation Age of Onset  . Diabetes Maternal Grandfather   . CAD Mother     ROS: no fevers or chills, productive cough, hemoptysis, dysphasia, odynophagia, melena, hematochezia, dysuria, hematuria, rash, seizure activity, orthopnea, PND, pedal edema, claudication. Remaining systems are negative.  Physical Exam: Well-developed well-nourished in no acute distress.  Skin is warm and dry.  HEENT is normal.  Neck is supple.  Chest is clear to auscultation with normal expansion.  Cardiovascular exam is regular rate and rhythm.  No murmur or change with Valsalva. Abdominal exam nontender or distended. No masses palpated. Extremities show no edema. neuro grossly intact  ECG-normal sinus rhythm at a rate of 59, right axis deviation.  Personally reviewed  A/P  1 family history of hypertrophic obstructive cardiomyopathy-we will plan to repeat echocardiogram June 2023.  Electrocardiogram remains normal.  2 hyperlipidemia-continue diet.  Check lipids.  3 history of elevated blood pressure-mildly elevated blood pressure today.  I have asked him to follow this and we will add medications as needed.  Olga Millers, MD

## 2018-05-25 ENCOUNTER — Ambulatory Visit (INDEPENDENT_AMBULATORY_CARE_PROVIDER_SITE_OTHER): Payer: 59 | Admitting: Cardiology

## 2018-05-25 ENCOUNTER — Encounter: Payer: Self-pay | Admitting: Cardiology

## 2018-05-25 VITALS — BP 140/80 | HR 59 | Ht 73.5 in | Wt 249.0 lb

## 2018-05-25 DIAGNOSIS — R030 Elevated blood-pressure reading, without diagnosis of hypertension: Secondary | ICD-10-CM | POA: Diagnosis not present

## 2018-05-25 DIAGNOSIS — E7800 Pure hypercholesterolemia, unspecified: Secondary | ICD-10-CM

## 2018-05-25 DIAGNOSIS — Z8279 Family history of other congenital malformations, deformations and chromosomal abnormalities: Secondary | ICD-10-CM | POA: Diagnosis not present

## 2018-05-25 NOTE — Patient Instructions (Signed)
Medication Instructions:   NO CHANGE  Labwork:  Your physician recommends that you HAVE LAB WORK TODAY  Follow-Up:  Your physician recommends that you schedule a follow-up appointment in: 2 YEARS WITH DR Jens Som

## 2018-05-26 LAB — LIPID PANEL
CHOL/HDL RATIO: 4.1 ratio (ref 0.0–5.0)
Cholesterol, Total: 206 mg/dL — ABNORMAL HIGH (ref 100–199)
HDL: 50 mg/dL (ref >39)
LDL Calculated: 133 mg/dL — ABNORMAL HIGH (ref 0–99)
Triglycerides: 114 mg/dL (ref 0–149)
VLDL CHOLESTEROL CAL: 23 mg/dL (ref 5–40)

## 2020-02-28 ENCOUNTER — Encounter: Payer: Self-pay | Admitting: Medical

## 2020-06-13 ENCOUNTER — Telehealth (INDEPENDENT_AMBULATORY_CARE_PROVIDER_SITE_OTHER): Payer: 59 | Admitting: Medical

## 2020-06-13 ENCOUNTER — Other Ambulatory Visit: Payer: Self-pay

## 2020-06-13 DIAGNOSIS — M25531 Pain in right wrist: Secondary | ICD-10-CM | POA: Diagnosis not present

## 2020-06-13 NOTE — Progress Notes (Signed)
Patient ID: Clarence Gonzales, male   DOB: [birth year], 30 y.o.   MRN: [id number] Virtual Visit via Video Note  I connected with Clarence Gonzales on 06/13/20 at  8:00 AM EST by a video enabled telemedicine application and verified that I am speaking with the correct person using two identifiers.  Location: Patient: home Provider: office  Participants-Pt and myself.   I discussed the limitations of evaluation and management by telemedicine and the availability of in person appointments. The patient expressed understanding and agreed to proceed.  History of Present Illness:   Pt states approximate one mont hof rt wrist pain. Pt states he thought maybe injured working out. He states over last month could do push up and pull ups no pain. But over past month noted when twist wrist has severe ulnar area pain. He has not taken an med for pain.   Observations/Objective:  General-no acute distress, pleasant, oriented. Lungs- on inspection lungs appear unlabored. Neck- no tracheal deviation or jvd on inspection. Neuro- gross motor function appears intact. Rt wrist- when pronates and supinates forearm has severe pain.   Assessment and Plan: Refer to [facility] for 1 month rt wrist pain. During interim use wrist cock up sprint and ibuprofen.   Gave [facility] number. Can call and see if they can get you in quickly.  Would defer to [facility] if xray indicated. They may be [place].   Follow up prn.  Follow Up Instructions:    I discussed the assessment and treatment plan with the patient. The patient was provided an opportunity to ask questions and all were answered. The patient agreed with the plan and demonstrated an understanding of the instructions.   The patient was advised to call back or seek an in-person evaluation if the symptoms worsen or if the condition fails to improve as anticipated.  Time spent with patient today was 12  minutes which consisted  discussing diagnosis, work up, referral,  treatment and documentation.   Esperanza Richters, PA-C

## 2020-06-13 NOTE — Patient Instructions (Addendum)
Refer to [facility] for 1 month rt wrist pain(tendon/soft tissue pain). During interim use wrist cock up sprint and can take  ibuprofen.   Gave [facility] number. Can call and see if they can get you in quickly.  Would defer to [facility] if xray indicated. They may be [place].   Follow up prn.

## 2020-06-17 ENCOUNTER — Ambulatory Visit: Payer: 59 | Admitting: Family Medicine

## 2020-06-17 NOTE — Progress Notes (Deleted)
  Clarence Gonzales - 30 y.o. male MRN [id number]  Date of birth: [birth year]  SUBJECTIVE:  Including CC & ROS.  No chief complaint on file.   Clarence Gonzales is a 30 y.o. male that is  ***.  ***   Review of Systems See HPI   HISTORY: Past Medical, Surgical, Social, and Family History Reviewed & Updated per EMR.   Pertinent Historical Findings include:  Past Medical History:  Diagnosis Date  . Asthma     Past Surgical History:  Procedure Laterality Date  . No previous surgery      Family History  Problem Relation Age of Onset  . Diabetes Maternal Grandfather   . CAD Mother     Social History   Socioeconomic History  . Marital status: Single    Spouse name: Not on file  . Number of children: Not on file  . Years of education: Not on file  . Highest education level: Not on file  Occupational History  . Not on file  Tobacco Use  . Smoking status: Never Smoker  . Smokeless tobacco: Never Used  Vaping Use  . Vaping Use: Never used  Substance and Sexual Activity  . Alcohol use: Yes    Alcohol/week: 0.0 standard drinks    Comment: Occasional  . Drug use: No  . Sexual activity: Not on file  Other Topics Concern  . Not on file  Social History Narrative  . Not on file   Social Determinants of Health   Financial Resource Strain: Not on file  Food Insecurity: Not on file  Transportation Needs: Not on file  Physical Activity: Not on file  Stress: Not on file  Social Connections: Not on file  Intimate Partner Violence: Not on file     PHYSICAL EXAM:  VS: There were no vitals taken for this visit. Physical Exam Gen: NAD, alert, cooperative with exam, well-appearing MSK:  ***      ASSESSMENT & PLAN:   No problem-specific Assessment & Plan notes found for this encounter.

## 2020-06-18 ENCOUNTER — Ambulatory Visit (INDEPENDENT_AMBULATORY_CARE_PROVIDER_SITE_OTHER): Payer: 59 | Admitting: Family Medicine

## 2020-06-18 ENCOUNTER — Other Ambulatory Visit: Payer: Self-pay

## 2020-06-18 ENCOUNTER — Ambulatory Visit: Payer: Self-pay

## 2020-06-18 VITALS — BP 126/80 | Ht 73.5 in | Wt 265.0 lb

## 2020-06-18 DIAGNOSIS — M67431 Ganglion, right wrist: Secondary | ICD-10-CM | POA: Diagnosis not present

## 2020-06-18 DIAGNOSIS — M25531 Pain in right wrist: Secondary | ICD-10-CM

## 2020-06-18 NOTE — Patient Instructions (Signed)
Nice to meet you Please try ice as needed  Please use ibuprofen as needed Please use the wrist brace if it helps Please send me a message in MyChart with any questions or updates.  Please see me back in 4 weeks.   --Dr. Jordan Likes

## 2020-06-18 NOTE — Assessment & Plan Note (Signed)
There appears to be a cyst as to the source of his pain.  Appears benign in nature. -Counseled on home exercise therapy and supportive care. -Counseled on bracing and ibuprofen. -Can consider aspiration

## 2020-06-18 NOTE — Progress Notes (Signed)
  Clarence Gonzales - 30 y.o. male MRN [id number]  Date of birth: 1990/07/30  SUBJECTIVE:  Including CC & ROS.  No chief complaint on file.   Clarence Gonzales is a 30 y.o. male that is presenting with right wrist pain.  The pain is been ongoing for 1 month.  Denies any injury or inciting event.  No history of surgery.  Symptoms are occurring in the ulnar aspect of the wrist.  Localized to this area.  Worse with ulnar sided compression.   Review of Systems See HPI   HISTORY: Past Medical, Surgical, Social, and Family History Reviewed & Updated per EMR.   Pertinent Historical Findings include:  Past Medical History:  Diagnosis Date  . Asthma     Past Surgical History:  Procedure Laterality Date  . No previous surgery      Family History  Problem Relation Age of Onset  . Diabetes Maternal Grandfather   . CAD Mother     Social History   Socioeconomic History  . Marital status: Single    Spouse name: Not on file  . Number of children: Not on file  . Years of education: Not on file  . Highest education level: Not on file  Occupational History  . Not on file  Tobacco Use  . Smoking status: Never Smoker  . Smokeless tobacco: Never Used  Vaping Use  . Vaping Use: Never used  Substance and Sexual Activity  . Alcohol use: Yes    Alcohol/week: 0.0 standard drinks    Comment: Occasional  . Drug use: No  . Sexual activity: Not on file  Other Topics Concern  . Not on file  Social History Narrative  . Not on file   Social Determinants of Health   Financial Resource Strain: Not on file  Food Insecurity: Not on file  Transportation Needs: Not on file  Physical Activity: Not on file  Stress: Not on file  Social Connections: Not on file  Intimate Partner Violence: Not on file     PHYSICAL EXAM:  VS: BP 126/80 (BP Location: Left Arm, Patient Position: Sitting, Cuff Size: Large)   Ht 6' 1.5" (1.867 m)   Wt 265 lb (120.2 kg)   BMI 34.49 kg/m  Physical Exam Gen: NAD, alert,  cooperative with exam, well-appearing MSK:  Right wrist: No swelling or ecchymosis. Normal range of motion. Normal strength resistance. Tenderness palpation of the distal aspect of the ulna. Neurovascularly intact  Limited ultrasound: Right wrist:  No effusion appreciated within the wrist joints. Normal-appearing distal ulna. Normal-appearing ECU. There appears to be a cyst just distal to the distal ulna.  Overlying the carpal bones.  No hyperemia in this area.  Summary: Findings would suggest cyst   Ultrasound and interpretation by Clare Gandy, MD    ASSESSMENT & PLAN:   Ganglion cyst of wrist, right There appears to be a cyst as to the source of his pain.  Appears benign in nature. -Counseled on home exercise therapy and supportive care. -Counseled on bracing and ibuprofen. -Can consider aspiration

## 2020-07-16 ENCOUNTER — Other Ambulatory Visit: Payer: Self-pay

## 2020-07-16 ENCOUNTER — Encounter: Payer: Self-pay | Admitting: Family Medicine

## 2020-07-16 ENCOUNTER — Ambulatory Visit: Payer: Self-pay

## 2020-07-16 ENCOUNTER — Ambulatory Visit (INDEPENDENT_AMBULATORY_CARE_PROVIDER_SITE_OTHER): Payer: 59 | Admitting: Family Medicine

## 2020-07-16 VITALS — BP 120/80 | Ht 73.5 in | Wt 265.0 lb

## 2020-07-16 DIAGNOSIS — M67431 Ganglion, right wrist: Secondary | ICD-10-CM

## 2020-07-16 NOTE — Progress Notes (Signed)
  Clarence Gonzales - 30 y.o. male MRN [id number]  Date of birth: [birth year]  SUBJECTIVE:  Including CC & ROS.  No chief complaint on file.   Clarence Gonzales is a 30 y.o. male that is following up for his right wrist pain.  He has been back to working out and he denies any significant pain.   Review of Systems See HPI   HISTORY: Past Medical, Surgical, Social, and Family History Reviewed & Updated per EMR.   Pertinent Historical Findings include:  Past Medical History:  Diagnosis Date  . Asthma     Past Surgical History:  Procedure Laterality Date  . No previous surgery      Family History  Problem Relation Age of Onset  . Diabetes Maternal Grandfather   . CAD Mother     Social History   Socioeconomic History  . Marital status: Single    Spouse name: Not on file  . Number of children: Not on file  . Years of education: Not on file  . Highest education level: Not on file  Occupational History  . Not on file  Tobacco Use  . Smoking status: Never Smoker  . Smokeless tobacco: Never Used  Vaping Use  . Vaping Use: Never used  Substance and Sexual Activity  . Alcohol use: Yes    Alcohol/week: 0.0 standard drinks    Comment: Occasional  . Drug use: No  . Sexual activity: Not on file  Other Topics Concern  . Not on file  Social History Narrative  . Not on file   Social Determinants of Health   Financial Resource Strain: Not on file  Food Insecurity: Not on file  Transportation Needs: Not on file  Physical Activity: Not on file  Stress: Not on file  Social Connections: Not on file  Intimate Partner Violence: Not on file     PHYSICAL EXAM:  VS: BP 120/80 (BP Location: Left Arm, Patient Position: Sitting, Cuff Size: Large)   Ht 6' 1.5" (1.867 m)   Wt 265 lb (120.2 kg)   BMI 34.49 kg/m  Physical Exam Gen: NAD, alert, cooperative with exam, well-appearing MSK:  Right wrist: Normal range of motion. No swelling or ecchymosis. Normal strength  resistance. Neurovascular intact  Limited ultrasound: right wrist:  Normal appearing distal ulna Ganglion has decreased in size  Summary: Decreased size of ganglion cyst of wrist  Ultrasound and interpretation by Clare Gandy, MD    ASSESSMENT & PLAN:   Ganglion cyst of wrist, right Improving ganglion cyst.  His pain has resolved. -Counseled on home exercise therapy and supportive care. -Could consider aspiration if needed.

## 2020-07-16 NOTE — Assessment & Plan Note (Signed)
Improving ganglion cyst.  His pain has resolved. -Counseled on home exercise therapy and supportive care. -Could consider aspiration if needed.

## 2020-07-31 NOTE — Progress Notes (Deleted)
      HPI: Follow-up chest pain, family history of coronary disease and family history of hypertrophic cardiomyopathy.  Echocardiogram June 2018 showed normal LV systolic function, mild left ventricular hypertrophy and mild left atrial enlargement.  Since last seen   No current outpatient medications on file.   No current facility-administered medications for this visit.     Past Medical History:  Diagnosis Date  . Asthma     Past Surgical History:  Procedure Laterality Date  . No previous surgery      Social History   Socioeconomic History  . Marital status: Single    Spouse name: Not on file  . Number of children: Not on file  . Years of education: Not on file  . Highest education level: Not on file  Occupational History  . Not on file  Tobacco Use  . Smoking status: Never Smoker  . Smokeless tobacco: Never Used  Vaping Use  . Vaping Use: Never used  Substance and Sexual Activity  . Alcohol use: Yes    Alcohol/week: 0.0 standard drinks    Comment: Occasional  . Drug use: No  . Sexual activity: Not on file  Other Topics Concern  . Not on file  Social History Narrative  . Not on file   Social Determinants of Health   Financial Resource Strain: Not on file  Food Insecurity: Not on file  Transportation Needs: Not on file  Physical Activity: Not on file  Stress: Not on file  Social Connections: Not on file  Intimate Partner Violence: Not on file    Family History  Problem Relation Age of Onset  . Diabetes Maternal Grandfather   . CAD Mother     ROS: no fevers or chills, productive cough, hemoptysis, dysphasia, odynophagia, melena, hematochezia, dysuria, hematuria, rash, seizure activity, orthopnea, PND, pedal edema, claudication. Remaining systems are negative.  Physical Exam: Well-developed well-nourished in no acute distress.  Skin is warm and dry.  HEENT is normal.  Neck is supple.  Chest is clear to auscultation with normal expansion.   Cardiovascular exam is regular rate and rhythm.  Abdominal exam nontender or distended. No masses palpated. Extremities show no edema. neuro grossly intact  ECG- personally reviewed  A/P  1 family history of hypertrophic obstructive cardiomyopathy-plan repeat echocardiogram June 2023.  Electrocardiogram is normal.  We discussed genetic testing today.  2 hyperlipidemia-continue diet.  Olga Millers, MD

## 2020-08-07 ENCOUNTER — Ambulatory Visit: Payer: 59 | Admitting: Cardiology

## 2022-08-17 ENCOUNTER — Encounter: Payer: Self-pay | Admitting: *Deleted
